# Patient Record
Sex: Female | Born: 1985 | Race: Black or African American | Hispanic: No | Marital: Single | Smoking: Former smoker
Health system: Southern US, Community
[De-identification: ages and names within clinical notes are randomized; demographics above are authoritative.]

## PROBLEM LIST (undated history)

## (undated) ENCOUNTER — Inpatient Hospital Stay (HOSPITAL_COMMUNITY): Payer: Self-pay

## (undated) DIAGNOSIS — N390 Urinary tract infection, site not specified: Secondary | ICD-10-CM

## (undated) DIAGNOSIS — Z789 Other specified health status: Secondary | ICD-10-CM

## (undated) DIAGNOSIS — Z8619 Personal history of other infectious and parasitic diseases: Secondary | ICD-10-CM

## (undated) DIAGNOSIS — Z87440 Personal history of urinary (tract) infections: Secondary | ICD-10-CM

## (undated) DIAGNOSIS — D696 Thrombocytopenia, unspecified: Secondary | ICD-10-CM

## (undated) DIAGNOSIS — O26859 Spotting complicating pregnancy, unspecified trimester: Secondary | ICD-10-CM

## (undated) DIAGNOSIS — Z22330 Carrier of Group B streptococcus: Secondary | ICD-10-CM

## (undated) DIAGNOSIS — Z8759 Personal history of other complications of pregnancy, childbirth and the puerperium: Secondary | ICD-10-CM

## (undated) DIAGNOSIS — Z331 Pregnant state, incidental: Secondary | ICD-10-CM

## (undated) HISTORY — DX: Spotting complicating pregnancy, unspecified trimester: O26.859

## (undated) HISTORY — DX: Personal history of urinary (tract) infections: Z87.440

## (undated) HISTORY — DX: Thrombocytopenia, unspecified: D69.6

## (undated) HISTORY — DX: Carrier of group B Streptococcus: Z22.330

## (undated) HISTORY — DX: Personal history of other infectious and parasitic diseases: Z86.19

## (undated) HISTORY — DX: Personal history of other complications of pregnancy, childbirth and the puerperium: Z87.59

## (undated) HISTORY — DX: Pregnant state, incidental: Z33.1

## (undated) HISTORY — PX: NO PAST SURGERIES: SHX2092

---

## 2009-08-13 ENCOUNTER — Inpatient Hospital Stay (HOSPITAL_COMMUNITY): Admission: AD | Admit: 2009-08-13 | Discharge: 2009-08-14 | Payer: Self-pay | Admitting: Obstetrics & Gynecology

## 2009-08-14 ENCOUNTER — Inpatient Hospital Stay (HOSPITAL_COMMUNITY): Admission: AD | Admit: 2009-08-14 | Discharge: 2009-08-16 | Payer: Self-pay | Admitting: Obstetrics and Gynecology

## 2009-08-18 ENCOUNTER — Inpatient Hospital Stay (HOSPITAL_COMMUNITY): Admission: AD | Admit: 2009-08-18 | Discharge: 2009-08-20 | Payer: Self-pay | Admitting: Obstetrics and Gynecology

## 2009-09-13 ENCOUNTER — Inpatient Hospital Stay (HOSPITAL_COMMUNITY): Admission: AD | Admit: 2009-09-13 | Discharge: 2009-09-13 | Payer: Self-pay | Admitting: Obstetrics and Gynecology

## 2010-05-20 LAB — COMPREHENSIVE METABOLIC PANEL
ALT: 12 U/L (ref 0–35)
AST: 15 U/L (ref 0–37)
AST: 16 U/L (ref 0–37)
AST: 16 U/L (ref 0–37)
Albumin: 2.4 g/dL — ABNORMAL LOW (ref 3.5–5.2)
Albumin: 2.6 g/dL — ABNORMAL LOW (ref 3.5–5.2)
Alkaline Phosphatase: 100 U/L (ref 39–117)
Alkaline Phosphatase: 81 U/L (ref 39–117)
Alkaline Phosphatase: 83 U/L (ref 39–117)
Alkaline Phosphatase: 91 U/L (ref 39–117)
BUN: 7 mg/dL (ref 6–23)
CO2: 22 mEq/L (ref 19–32)
CO2: 26 mEq/L (ref 19–32)
Calcium: 8.4 mg/dL (ref 8.4–10.5)
Calcium: 8.6 mg/dL (ref 8.4–10.5)
Chloride: 105 mEq/L (ref 96–112)
Chloride: 106 mEq/L (ref 96–112)
Chloride: 109 mEq/L (ref 96–112)
Creatinine, Ser: 0.85 mg/dL (ref 0.4–1.2)
GFR calc Af Amer: >60 mL/min (ref >60)
GFR calc non Af Amer: >60 mL/min (ref >60)
GFR calc non Af Amer: >60 mL/min (ref >60)
Glucose, Bld: 92 mg/dL (ref 70–99)
Potassium: 3.4 mEq/L — ABNORMAL LOW (ref 3.5–5.1)
Potassium: 3.9 mEq/L (ref 3.5–5.1)
Sodium: 136 mEq/L (ref 135–145)
Sodium: 136 mEq/L (ref 135–145)
Total Bilirubin: 0.4 mg/dL (ref 0.3–1.2)
Total Bilirubin: 0.5 mg/dL (ref 0.3–1.2)
Total Protein: 4.6 g/dL — ABNORMAL LOW (ref 6.0–8.3)
Total Protein: 6.9 g/dL (ref 6.0–8.3)

## 2010-05-20 LAB — CBC
HCT: 38.9 % (ref 36.0–46.0)
Hemoglobin: 10.4 g/dL — ABNORMAL LOW (ref 12.0–15.0)
Hemoglobin: 11.2 g/dL — ABNORMAL LOW (ref 12.0–15.0)
Hemoglobin: 13.2 g/dL (ref 12.0–15.0)
MCH: 32.3 pg (ref 26.0–34.0)
MCHC: 34.6 g/dL (ref 30.0–36.0)
MCHC: 35 g/dL (ref 30.0–36.0)
MCV: 95.6 fL (ref 78.0–100.0)
MCV: 97.3 fL (ref 78.0–100.0)
MCV: 97.4 fL (ref 78.0–100.0)
Platelets: 158 10*3/uL (ref 150–400)
Platelets: 166 10*3/uL (ref 150–400)
Platelets: 224 10*3/uL (ref 150–400)
RBC: 3.08 MIL/uL — ABNORMAL LOW (ref 3.87–5.11)
RBC: 3.28 MIL/uL — ABNORMAL LOW (ref 3.87–5.11)
RBC: 4.07 MIL/uL (ref 3.87–5.11)
RDW: 13.4 % (ref 11.5–15.5)
WBC: 7.6 10*3/uL (ref 4.0–10.5)
WBC: 9.6 10*3/uL (ref 4.0–10.5)

## 2010-05-20 LAB — DIFFERENTIAL
Basophils Absolute: 0 10*3/uL (ref 0.0–0.1)
Basophils Absolute: 0 10*3/uL (ref 0.0–0.1)
Basophils Relative: 0 % (ref 0–1)
Eosinophils Absolute: 0.1 10*3/uL (ref 0.0–0.7)
Eosinophils Absolute: 0.5 10*3/uL (ref 0.0–0.7)
Eosinophils Relative: 1 % (ref 0–5)
Lymphocytes Relative: 32 % (ref 12–46)
Lymphs Abs: 1.6 10*3/uL (ref 0.7–4.0)
Monocytes Absolute: 1.4 10*3/uL — ABNORMAL HIGH (ref 0.1–1.0)
Monocytes Relative: 7 % (ref 3–12)
Neutro Abs: 4.2 10*3/uL (ref 1.7–7.7)
Neutro Abs: 7.6 10*3/uL (ref 1.7–7.7)
Neutrophils Relative %: 55 % (ref 43–77)
Neutrophils Relative %: 70 % (ref 43–77)

## 2010-05-20 LAB — LACTATE DEHYDROGENASE: LDH: 109 U/L (ref 94–250)

## 2010-05-20 LAB — URIC ACID
Uric Acid, Serum: 5.6 mg/dL (ref 2.4–7.0)
Uric Acid, Serum: 5.9 mg/dL (ref 2.4–7.0)

## 2010-05-20 LAB — URINALYSIS, ROUTINE W REFLEX MICROSCOPIC
Bilirubin Urine: NEGATIVE
Ketones, ur: NEGATIVE mg/dL
Specific Gravity, Urine: 1.025 (ref 1.005–1.030)
Urobilinogen, UA: 1 mg/dL (ref 0.0–1.0)
pH: 6.5 (ref 5.0–8.0)

## 2010-05-20 LAB — URINE CULTURE

## 2010-05-20 LAB — URINE MICROSCOPIC-ADD ON

## 2010-05-20 LAB — CCBB MATERNAL DONOR DRAW

## 2010-05-20 LAB — RPR: RPR Ser Ql: NONREACTIVE

## 2010-05-21 LAB — URINALYSIS, ROUTINE W REFLEX MICROSCOPIC
Bilirubin Urine: NEGATIVE
Hgb urine dipstick: NEGATIVE
Ketones, ur: 15 mg/dL — AB
Ketones, ur: NEGATIVE mg/dL
Nitrite: NEGATIVE
Protein, ur: NEGATIVE mg/dL
Specific Gravity, Urine: 1.015 (ref 1.005–1.030)
Urobilinogen, UA: 1 mg/dL (ref 0.0–1.0)
pH: 7.5 (ref 5.0–8.0)

## 2010-05-21 LAB — URINE CULTURE

## 2010-05-21 LAB — COMPREHENSIVE METABOLIC PANEL
ALT: 14 U/L (ref 0–35)
Alkaline Phosphatase: 113 U/L (ref 39–117)
BUN: 3 mg/dL — ABNORMAL LOW (ref 6–23)
Chloride: 105 mEq/L (ref 96–112)
Glucose, Bld: 91 mg/dL (ref 70–99)
Potassium: 3.7 mEq/L (ref 3.5–5.1)
Sodium: 135 mEq/L (ref 135–145)
Total Bilirubin: 0.6 mg/dL (ref 0.3–1.2)
Total Protein: 6.5 g/dL (ref 6.0–8.3)

## 2010-05-21 LAB — DIFFERENTIAL
Basophils Absolute: 0 10*3/uL (ref 0.0–0.1)
Basophils Absolute: 0 10*3/uL (ref 0.0–0.1)
Lymphocytes Relative: 6 % — ABNORMAL LOW (ref 12–46)
Lymphocytes Relative: 8 % — ABNORMAL LOW (ref 12–46)
Lymphs Abs: 1.1 10*3/uL (ref 0.7–4.0)
Monocytes Absolute: 1.3 10*3/uL — ABNORMAL HIGH (ref 0.1–1.0)
Neutro Abs: 11.5 10*3/uL — ABNORMAL HIGH (ref 1.7–7.7)
Neutro Abs: 13.3 10*3/uL — ABNORMAL HIGH (ref 1.7–7.7)
Neutrophils Relative %: 89 % — ABNORMAL HIGH (ref 43–77)

## 2010-05-21 LAB — CBC
HCT: 36.7 % (ref 36.0–46.0)
Hemoglobin: 11.3 g/dL — ABNORMAL LOW (ref 12.0–15.0)
Hemoglobin: 12.5 g/dL (ref 12.0–15.0)
Platelets: 143 10*3/uL — ABNORMAL LOW (ref 150–400)
RBC: 3.35 MIL/uL — ABNORMAL LOW (ref 3.87–5.11)
RBC: 3.76 MIL/uL — ABNORMAL LOW (ref 3.87–5.11)
RDW: 13.8 % (ref 11.5–15.5)
RDW: 13.8 % (ref 11.5–15.5)
RDW: 13.9 % (ref 11.5–15.5)
WBC: 12.9 10*3/uL — ABNORMAL HIGH (ref 4.0–10.5)
WBC: 14 10*3/uL — ABNORMAL HIGH (ref 4.0–10.5)
WBC: 15 10*3/uL — ABNORMAL HIGH (ref 4.0–10.5)

## 2010-05-21 LAB — CREATININE CLEARANCE, URINE, 24 HOUR
Creatinine, Urine: 26.2 mg/dL
Creatinine: 0.88 mg/dL (ref 0.4–1.2)

## 2010-05-21 LAB — LACTATE DEHYDROGENASE: LDH: 155 U/L (ref 94–250)

## 2010-05-21 LAB — WET PREP, GENITAL: Clue Cells Wet Prep HPF POC: NONE SEEN

## 2010-05-21 LAB — RPR: RPR Ser Ql: NONREACTIVE

## 2010-05-21 LAB — PROTEIN, URINE, 24 HOUR

## 2011-01-10 ENCOUNTER — Emergency Department (HOSPITAL_COMMUNITY)
Admission: EM | Admit: 2011-01-10 | Discharge: 2011-01-10 | Disposition: A | Discharge status: Home / Self Care | Payer: Medicaid Other | Attending: Emergency Medicine | Admitting: Emergency Medicine

## 2011-01-10 ENCOUNTER — Encounter (HOSPITAL_COMMUNITY): Payer: Self-pay | Admitting: *Deleted

## 2011-01-10 DIAGNOSIS — Z331 Pregnant state, incidental: Secondary | ICD-10-CM

## 2011-01-10 DIAGNOSIS — Z3201 Encounter for pregnancy test, result positive: Secondary | ICD-10-CM | POA: Insufficient documentation

## 2011-01-10 LAB — URINALYSIS, ROUTINE W REFLEX MICROSCOPIC
Bilirubin Urine: NEGATIVE
Glucose, UA: NEGATIVE mg/dL
Hgb urine dipstick: NEGATIVE
Ketones, ur: NEGATIVE mg/dL
Nitrite: POSITIVE — AB
Protein, ur: NEGATIVE mg/dL
Specific Gravity, Urine: 1.024 (ref 1.005–1.030)
Urobilinogen, UA: 1 mg/dL (ref 0.0–1.0)
pH: 7 (ref 5.0–8.0)

## 2011-01-10 LAB — URINE MICROSCOPIC-ADD ON

## 2011-01-10 LAB — POCT PREGNANCY, URINE: Preg Test, Ur: POSITIVE

## 2011-01-10 NOTE — ED Provider Notes (Signed)
History   This is G1P1 patient presents to the ED with a request for a pregnancy test.  She stated her last menstrual period was September 22. She noticed that she was late and proceed to get a home pregnancy test, which is positive. She mentioned that she talks to the OB/GYN, Dr. Debria Garret office who recommends patient to come to the ED to have a documented positive pregnancy test in order to qualify for Medicaid. She denies abdominal pain, or vaginal bleeding. She has no other complaints.  CSN: [id number] Arrival date & time: 01/10/2011 11:02 AM   First MD Initiated Contact with Patient 01/10/11 1119      No chief complaint on file.   (Consider location/radiation/quality/duration/timing/severity/associated sxs/prior treatment) The history is provided by the patient. No language interpreter was used.    History reviewed. No pertinent past medical history.  History reviewed. No pertinent past surgical history.  No family history on file.  History  Substance Use Topics  . Smoking status: Never Smoker   . Smokeless tobacco: Not on file  . Alcohol Use: No    OB History    Grav Para Term Preterm Abortions TAB SAB Ect Mult Living                  Review of Systems  All other systems reviewed and are negative.    Allergies  Review of patient's allergies indicates no known allergies.  Home Medications  No current outpatient prescriptions on file.  BP 129/82  Pulse 87  Temp(Src) 98.2 F (36.8 C) (Oral)  Resp 20  SpO2 100%  Physical Exam  Nursing note and vitals reviewed. Constitutional:       Awake, alert, nontoxic appearance  HENT:  Head: Atraumatic.  Eyes: Right eye exhibits no discharge. Left eye exhibits no discharge.  Neck: Neck supple.  Pulmonary/Chest: Effort normal. She exhibits no tenderness.  Abdominal: There is no tenderness. There is no rebound.  Musculoskeletal: She exhibits no tenderness.       Baseline ROM, no obvious new focal weakness    Neurological:       Mental status and motor strength appears baseline for patient and situation  Skin: No rash noted.  Psychiatric: She has a normal mood and affect.    ED Course  Procedures (including critical care time)   Labs Reviewed  POCT PREGNANCY, URINE  URINALYSIS, ROUTINE W REFLEX MICROSCOPIC   No results found.   No diagnosis found.      MDM  Patient has a positive urine pregnancy test. She has no other complaint. HCG and Quant would not be checked at this time. Patient agrees to follow the OB/GYN.        Fayrene Helper, PA Resident 01/10/11 1146

## 2011-01-10 NOTE — ED Notes (Signed)
Pt states took home preg test and was positive....states called dr stringer office to request care and they advised her to come to the ed and get documentation of pregnancy so she could get medicaid to cover the pregnancy...  Pt denies bleeding or discharge.

## 2011-01-10 NOTE — ED Notes (Signed)
Just needs preg test  G 2 P1 A0 L1

## 2011-01-10 NOTE — ED Provider Notes (Signed)
Medical screening examination/treatment/procedure(s) were performed by non-physician practitioner and as supervising physician I was immediately available for consultation/collaboration.  Raeford Razor, MD 01/10/11 1754

## 2011-01-14 ENCOUNTER — Encounter (HOSPITAL_COMMUNITY): Payer: Self-pay | Admitting: *Deleted

## 2011-01-14 NOTE — ED Notes (Signed)
Pt seen at [facility], sent to [facility] for proof of preg letter.

## 2011-01-22 LAB — OB RESULTS CONSOLE HIV ANTIBODY (ROUTINE TESTING): HIV: NONREACTIVE

## 2011-01-22 LAB — OB RESULTS CONSOLE RUBELLA ANTIBODY, IGM: Rubella: IMMUNE

## 2011-02-27 ENCOUNTER — Encounter (HOSPITAL_COMMUNITY): Payer: Self-pay | Admitting: *Deleted

## 2011-02-27 ENCOUNTER — Inpatient Hospital Stay (HOSPITAL_COMMUNITY)
Admission: AD | Admit: 2011-02-27 | Discharge: 2011-02-27 | Disposition: A | Discharge status: Home / Self Care | Payer: Medicaid Other | Source: Ambulatory Visit | Attending: Obstetrics and Gynecology | Admitting: Obstetrics and Gynecology

## 2011-02-27 DIAGNOSIS — O99891 Other specified diseases and conditions complicating pregnancy: Secondary | ICD-10-CM | POA: Insufficient documentation

## 2011-02-27 DIAGNOSIS — R109 Unspecified abdominal pain: Secondary | ICD-10-CM | POA: Insufficient documentation

## 2011-02-27 DIAGNOSIS — O98919 Unspecified maternal infectious and parasitic disease complicating pregnancy, unspecified trimester: Secondary | ICD-10-CM

## 2011-02-27 HISTORY — DX: Urinary tract infection, site not specified: N39.0

## 2011-02-27 LAB — URINALYSIS, ROUTINE W REFLEX MICROSCOPIC
Bilirubin Urine: NEGATIVE
Hgb urine dipstick: NEGATIVE
Ketones, ur: NEGATIVE mg/dL
Protein, ur: NEGATIVE mg/dL
Urobilinogen, UA: 0.2 mg/dL (ref 0.0–1.0)

## 2011-02-27 NOTE — Progress Notes (Signed)
Pt states lower abd shooting pain into her vagina that began last pm, no intercourse since October. Denies bleeding or vaginal d/c changes. +FHT's in triage.

## 2011-02-27 NOTE — Progress Notes (Signed)
History    25 yo g2p1 LMP certain 11/24/2010  With [facility] 08/31/11 presents with c/o of sporatic cramping all week, more crampy last night, shower makes me more comfortable, hx of frequent UTIs, Denies N,V,D, or UTI s/s, no vag bleeding or leaking fluid, no vag irritation. Chief Complaint  Patient presents with  . Abdominal Pain     OB History    Grav Para Term Preterm Abortions TAB SAB Ect Mult Living   2 1 1  0 0 0 0 0 0 1      Past Medical History  Diagnosis Date  . Urinary tract infection     Past Surgical History  Procedure Date  . No past surgeries     Family History  Problem Relation Age of Onset  . Anesthesia problems Neg Hx     History  Substance Use Topics  . Smoking status: Former Games developer  . Smokeless tobacco: Never Used  . Alcohol Use: No    Allergies: No Known Allergies  Prescriptions prior to admission  Medication Sig Dispense Refill  . folic acid (FOLVITE) 1 MG tablet Take 1 mg by mouth daily.           Physical Exam  Abd soft nontender, EGBUS wnl, cervix LTC no CMT, 12 week size Blood pressure 124/84, pulse 76, temperature 98.8 F (37.1 C), temperature source Oral, resp. rate 16, height 5\' 7"  (1.702 m), last menstrual period 11/24/2010. UA neg, UA C&S pending  A probable 12 week IUP P routine follow up insurance pending, ua C&S pending, discussed increase po water, frequent voids, tylenol, motrin for discomfort. ABCs of pg given. Discussed PNV daily. Lavera Guise, CNM

## 2011-02-27 NOTE — Progress Notes (Signed)
Sharp pain, pulling sensation noted increase past wk, esp when up and walking.  Had cramping initially.

## 2011-02-28 LAB — URINE CULTURE: Culture: NO GROWTH

## 2011-03-05 NOTE — L&D Delivery Note (Signed)
Delivery Note At 5:09 PM a viable female was delivered via Vaginal, Spontaneous Delivery (Presentation: Left Occiput Anterior).     Positon, easy delivery of the head, easy delivery of the shoulders Baby placed on pt abd, cord doubly clamped and cut Placenta schultze 3 vessel cord intact APGAR: 8, 8; weight .     Anesthesia: Epidural  Episiotomy: None Lacerations: 2 degree vag lac superficial Suture Repair: 4-0 monocryl Est. Blood Loss (mL): 200  Mom to postpartum.  Baby to rooming in.  Tiffany Tapia 08/25/2011, 5:41 PM

## 2011-05-27 ENCOUNTER — Encounter (INDEPENDENT_AMBULATORY_CARE_PROVIDER_SITE_OTHER): Payer: Medicaid Other | Admitting: Obstetrics and Gynecology

## 2011-05-27 ENCOUNTER — Other Ambulatory Visit: Payer: Medicaid Other

## 2011-05-27 DIAGNOSIS — Z3480 Encounter for supervision of other normal pregnancy, unspecified trimester: Secondary | ICD-10-CM

## 2011-06-19 ENCOUNTER — Ambulatory Visit (INDEPENDENT_AMBULATORY_CARE_PROVIDER_SITE_OTHER): Payer: Medicaid Other | Admitting: Obstetrics and Gynecology

## 2011-06-19 VITALS — BP 100/79 | Wt 163.0 lb

## 2011-06-19 DIAGNOSIS — Z331 Pregnant state, incidental: Secondary | ICD-10-CM

## 2011-06-19 NOTE — Progress Notes (Signed)
Addended by: Malissa Hippo. on: 06/19/2011 05:54 PM   Modules accepted: Orders

## 2011-06-19 NOTE — Progress Notes (Signed)
No c/o GFM rv'd PTL sx's, and FKC Will drawn abo to confirm O pos per consult with Dr. Normand Sloop

## 2011-06-20 ENCOUNTER — Telehealth: Payer: Self-pay | Admitting: Obstetrics and Gynecology

## 2011-06-20 LAB — ABO AND RH: Rh Type: POSITIVE

## 2011-06-20 NOTE — Telephone Encounter (Signed)
Routed to triage 

## 2011-06-24 ENCOUNTER — Telehealth: Payer: Self-pay | Admitting: Obstetrics and Gynecology

## 2011-06-24 NOTE — Telephone Encounter (Signed)
Cant leave msg on pt's voice mail. Phone not in service. bt cma

## 2011-06-25 MED ORDER — PRENATAL MULTIVITAMIN CH: 1.0000 | ORAL_TABLET | Freq: Every day | ORAL | Status: AC

## 2011-06-25 NOTE — Telephone Encounter (Signed)
Lm on vm rx sent to pharm call office if she has any additional questions or concerns

## 2011-07-04 ENCOUNTER — Ambulatory Visit (INDEPENDENT_AMBULATORY_CARE_PROVIDER_SITE_OTHER): Payer: Medicaid Other | Admitting: Obstetrics and Gynecology

## 2011-07-04 ENCOUNTER — Encounter: Payer: Self-pay | Admitting: Obstetrics and Gynecology

## 2011-07-04 VITALS — BP 98/58 | Wt 169.0 lb

## 2011-07-04 DIAGNOSIS — Z331 Pregnant state, incidental: Secondary | ICD-10-CM

## 2011-07-04 HISTORY — DX: Pregnant state, incidental: Z33.1

## 2011-07-04 LAB — GLUCOSE, POCT (MANUAL RESULT ENTRY): POC Glucose: 114

## 2011-07-04 NOTE — Progress Notes (Signed)
Blood Glucose- 114 Pt states she had a peanut butter jelly sandwich, cheetos, chocolate shake & coke @ 2:00pm

## 2011-07-04 NOTE — Progress Notes (Signed)
Patient ID: Tiffany Tapia, female   DOB: 21-Apr-1985, 26 y.o.   MRN: [id number] Reviewed s/s preterm labor, srom, vag bleeding, kick counts to report, enc 8 water daily and frequent voids

## 2011-07-04 NOTE — Patient Instructions (Signed)

## 2011-07-15 ENCOUNTER — Ambulatory Visit (INDEPENDENT_AMBULATORY_CARE_PROVIDER_SITE_OTHER): Payer: Medicaid Other | Admitting: Obstetrics and Gynecology

## 2011-07-15 ENCOUNTER — Encounter: Payer: Self-pay | Admitting: Obstetrics and Gynecology

## 2011-07-15 VITALS — BP 110/70 | Wt 173.0 lb

## 2011-07-15 DIAGNOSIS — Z331 Pregnant state, incidental: Secondary | ICD-10-CM

## 2011-07-15 NOTE — Progress Notes (Signed)
C/o left lower back pain, sneezing makes it worst, pt states had same back issues with 1st pregnancy

## 2011-07-15 NOTE — Progress Notes (Signed)
Patient ID: Tiffany Tapia, female   DOB: Apr 25, 1985, 26 y.o.   MRN: [id number] Reviewed s/s preterm labor, srom, vag bleeding, kick counts to report, enc 8 water daily and frequent voids, common discomforts of pg reviewed. Lavera Guise, CNM

## 2011-07-31 ENCOUNTER — Encounter: Payer: Self-pay | Admitting: Obstetrics and Gynecology

## 2011-07-31 ENCOUNTER — Ambulatory Visit (INDEPENDENT_AMBULATORY_CARE_PROVIDER_SITE_OTHER): Payer: Medicaid Other | Admitting: Obstetrics and Gynecology

## 2011-07-31 VITALS — BP 108/60 | Ht 67.0 in | Wt 177.0 lb

## 2011-07-31 DIAGNOSIS — Z331 Pregnant state, incidental: Secondary | ICD-10-CM

## 2011-07-31 NOTE — Progress Notes (Signed)
C/o lower left back pain. Also c/o vaginal pain. "Hurts to walk" GBS/GC/CHL DONE Checklist complete

## 2011-07-31 NOTE — Patient Instructions (Signed)
Contraception Choices Contraception (birth control) is the use of any methods or devices to prevent pregnancy. Below are some methods to help avoid pregnancy. HORMONAL METHODS   Contraceptive implant. This is a thin, plastic tube containing progesterone hormone. It does not contain estrogen hormone. Your caregiver inserts the tube in the inner part of the upper arm. The tube can remain in place for up to 3 years. After 3 years, the implant must be removed. The implant prevents the ovaries from releasing an egg (ovulation), thickens the cervical mucus which prevents sperm from entering the uterus, and thins the lining of the inside of the uterus.   Progesterone-only injections. These injections are given every 3 months by your caregiver to prevent pregnancy. This synthetic progesterone hormone stops the ovaries from releasing eggs. It also thickens cervical mucus and changes the uterine lining. This makes it harder for sperm to survive in the uterus.   Birth control pills. These pills contain estrogen and progesterone hormone. They work by stopping the egg from forming in the ovary (ovulation). Birth control pills are prescribed by a caregiver.Birth control pills can also be used to treat heavy periods.   Minipill. This type of birth control pill contains only the progesterone hormone. They are taken every day of each month and must be prescribed by your caregiver.   Birth control patch. The patch contains hormones similar to those in birth control pills. It must be changed once a week and is prescribed by a caregiver.   Vaginal ring. The ring contains hormones similar to those in birth control pills. It is left in the vagina for 3 weeks, removed for 1 week, and then a new one is put back in place. The patient must be comfortable inserting and removing the ring from the vagina.A caregiver's prescription is necessary.   Emergency contraception. Emergency contraceptives prevent pregnancy after  unprotected sexual intercourse. This pill can be taken right after sex or up to 5 days after unprotected sex. It is most effective the sooner you take the pills after having sexual intercourse. Emergency contraceptive pills are available without a prescription. Check with your pharmacist. Do not use emergency contraception as your only form of birth control.  BARRIER METHODS   Female condom. This is a thin sheath (latex or rubber) that is worn over the penis during sexual intercourse. It can be used with spermicide to increase effectiveness.   Female condom. This is a soft, loose-fitting sheath that is put into the vagina before sexual intercourse.   Diaphragm. This is a soft, latex, dome-shaped barrier that must be fitted by a caregiver. It is inserted into the vagina, along with a spermicidal jelly. It is inserted before intercourse. The diaphragm should be left in the vagina for 6 to 8 hours after intercourse.   Cervical cap. This is a round, soft, latex or plastic cup that fits over the cervix and must be fitted by a caregiver. The cap can be left in place for up to 48 hours after intercourse.   Sponge. This is a soft, circular piece of polyurethane foam. The sponge has spermicide in it. It is inserted into the vagina after wetting it and before sexual intercourse.   Spermicides. These are chemicals that kill or block sperm from entering the cervix and uterus. They come in the form of creams, jellies, suppositories, foam, or tablets. They do not require a prescription. They are inserted into the vagina with an applicator before having sexual intercourse. The process must be   repeated every time you have sexual intercourse.  INTRAUTERINE CONTRACEPTION  Intrauterine device (IUD). This is a T-shaped device that is put in a woman's uterus during a menstrual period to prevent pregnancy. There are 2 types:   Copper IUD. This type of IUD is wrapped in copper wire and is placed inside the uterus. Copper  makes the uterus and fallopian tubes produce a fluid that kills sperm. It can stay in place for 10 years.   Hormone IUD. This type of IUD contains the hormone progestin (synthetic progesterone). The hormone thickens the cervical mucus and prevents sperm from entering the uterus, and it also thins the uterine lining to prevent implantation of a fertilized egg. The hormone can weaken or kill the sperm that get into the uterus. It can stay in place for 5 years.  PERMANENT METHODS OF CONTRACEPTION  Female tubal ligation. This is when the woman's fallopian tubes are surgically sealed, tied, or blocked to prevent the egg from traveling to the uterus.   Female sterilization. This is when the female has the tubes that carry sperm tied off (vasectomy).This blocks sperm from entering the vagina during sexual intercourse. After the procedure, the man can still ejaculate fluid (semen).  NATURAL PLANNING METHODS  Natural family planning. This is not having sexual intercourse or using a barrier method (condom, diaphragm, cervical cap) on days the woman could become pregnant.   Calendar method. This is keeping track of the length of each menstrual cycle and identifying when you are fertile.   Ovulation method. This is avoiding sexual intercourse during ovulation.   Symptothermal method. This is avoiding sexual intercourse during ovulation, using a thermometer and ovulation symptoms.   Post-ovulation method. This is timing sexual intercourse after you have ovulated.  Regardless of which type or method of contraception you choose, it is important that you use condoms to protect against the transmission of sexually transmitted diseases (STDs). Talk with your caregiver about which form of contraception is most appropriate for you. Document Released: 02/18/2005 Document Revised: 02/07/2011 Document Reviewed: 06/27/2010 [employer] Patient Information 2012 [employer]. 

## 2011-08-05 ENCOUNTER — Encounter: Payer: Self-pay | Admitting: Obstetrics and Gynecology

## 2011-08-05 ENCOUNTER — Ambulatory Visit (INDEPENDENT_AMBULATORY_CARE_PROVIDER_SITE_OTHER): Payer: Medicaid Other | Admitting: Obstetrics and Gynecology

## 2011-08-05 VITALS — BP 110/70 | Wt 179.0 lb

## 2011-08-05 DIAGNOSIS — Z331 Pregnant state, incidental: Secondary | ICD-10-CM

## 2011-08-05 NOTE — Patient Instructions (Signed)
Fetal Movement Counts Patient Name: __________________________________________________ Patient Due Date: ____________________ Kick counts is highly recommended in high risk pregnancies, but it is a good idea for every pregnant woman to do. Start counting fetal movements at 28 weeks of the pregnancy. Fetal movements increase after eating a full meal or eating or drinking something sweet (the blood sugar is higher). It is also important to drink plenty of fluids (well hydrated) before doing the count. Lie on your left side because it helps with the circulation or you can sit in a comfortable chair with your arms over your belly (abdomen) with no distractions around you. DOING THE COUNT  Try to do the count the same time of day each time you do it.   Mark the day and time, then see how long it takes for you to feel 10 movements (kicks, flutters, swishes, rolls). You should have at least 10 movements within 2 hours. You will most likely feel 10 movements in much less than 2 hours. If you do not, wait an hour and count again. After a couple of days you will see a pattern.   What you are looking for is a change in the pattern or not enough counts in 2 hours. Is it taking longer in time to reach 10 movements?  SEEK MEDICAL CARE IF:  You feel less than 10 counts in 2 hours. Tried twice.   No movement in one hour.   The pattern is changing or taking longer each day to reach 10 counts in 2 hours.   You feel the baby is not moving as it usually does.  Date: ____________ Movements: ____________ Start time: ____________ Finish time: ____________  Date: ____________ Movements: ____________ Start time: ____________ Finish time: ____________ Date: ____________ Movements: ____________ Start time: ____________ Finish time: ____________ Date: ____________ Movements: ____________ Start time: ____________ Finish time: ____________ Date: ____________ Movements: ____________ Start time: ____________ Finish time:  ____________ Date: ____________ Movements: ____________ Start time: ____________ Finish time: ____________ Date: ____________ Movements: ____________ Start time: ____________ Finish time: ____________ Date: ____________ Movements: ____________ Start time: ____________ Finish time: ____________  Date: ____________ Movements: ____________ Start time: ____________ Finish time: ____________ Date: ____________ Movements: ____________ Start time: ____________ Finish time: ____________ Date: ____________ Movements: ____________ Start time: ____________ Finish time: ____________ Date: ____________ Movements: ____________ Start time: ____________ Finish time: ____________ Date: ____________ Movements: ____________ Start time: ____________ Finish time: ____________ Date: ____________ Movements: ____________ Start time: ____________ Finish time: ____________ Date: ____________ Movements: ____________ Start time: ____________ Finish time: ____________  Date: ____________ Movements: ____________ Start time: ____________ Finish time: ____________ Date: ____________ Movements: ____________ Start time: ____________ Finish time: ____________ Date: ____________ Movements: ____________ Start time: ____________ Finish time: ____________ Date: ____________ Movements: ____________ Start time: ____________ Finish time: ____________ Date: ____________ Movements: ____________ Start time: ____________ Finish time: ____________ Date: ____________ Movements: ____________ Start time: ____________ Finish time: ____________ Date: ____________ Movements: ____________ Start time: ____________ Finish time: ____________  Date: ____________ Movements: ____________ Start time: ____________ Finish time: ____________ Date: ____________ Movements: ____________ Start time: ____________ Finish time: ____________ Date: ____________ Movements: ____________ Start time: ____________ Finish time: ____________ Date: ____________ Movements:  ____________ Start time: ____________ Finish time: ____________ Date: ____________ Movements: ____________ Start time: ____________ Finish time: ____________ Date: ____________ Movements: ____________ Start time: ____________ Finish time: ____________ Date: ____________ Movements: ____________ Start time: ____________ Finish time: ____________  Date: ____________ Movements: ____________ Start time: ____________ Finish time: ____________ Date: ____________ Movements: ____________ Start time: ____________ Finish time: ____________ Date: ____________ Movements: ____________ Start time:   ____________ Finish time: ____________ Date: ____________ Movements: ____________ Start time: ____________ Finish time: ____________ Date: ____________ Movements: ____________ Start time: ____________ Finish time: ____________ Date: ____________ Movements: ____________ Start time: ____________ Finish time: ____________ Date: ____________ Movements: ____________ Start time: ____________ Finish time: ____________  Date: ____________ Movements: ____________ Start time: ____________ Finish time: ____________ Date: ____________ Movements: ____________ Start time: ____________ Finish time: ____________ Date: ____________ Movements: ____________ Start time: ____________ Finish time: ____________ Date: ____________ Movements: ____________ Start time: ____________ Finish time: ____________ Date: ____________ Movements: ____________ Start time: ____________ Finish time: ____________ Date: ____________ Movements: ____________ Start time: ____________ Finish time: ____________ Date: ____________ Movements: ____________ Start time: ____________ Finish time: ____________  Date: ____________ Movements: ____________ Start time: ____________ Finish time: ____________ Date: ____________ Movements: ____________ Start time: ____________ Finish time: ____________ Date: ____________ Movements: ____________ Start time: ____________ Finish  time: ____________ Date: ____________ Movements: ____________ Start time: ____________ Finish time: ____________ Date: ____________ Movements: ____________ Start time: ____________ Finish time: ____________ Date: ____________ Movements: ____________ Start time: ____________ Finish time: ____________ Date: ____________ Movements: ____________ Start time: ____________ Finish time: ____________  Date: ____________ Movements: ____________ Start time: ____________ Finish time: ____________ Date: ____________ Movements: ____________ Start time: ____________ Finish time: ____________ Date: ____________ Movements: ____________ Start time: ____________ Finish time: ____________ Date: ____________ Movements: ____________ Start time: ____________ Finish time: ____________ Date: ____________ Movements: ____________ Start time: ____________ Finish time: ____________ Date: ____________ Movements: ____________ Start time: ____________ Finish time: ____________ Document Released: 03/20/2006 Document Revised: 02/07/2011 Document Reviewed: 09/20/2008 [employer] Patient Information 2012 [employer]. 

## 2011-08-05 NOTE — Progress Notes (Signed)
A/P GBS negative Fetal kick counts reviewed Labor reviewed with pt All patients  questions answered 

## 2011-08-05 NOTE — Progress Notes (Signed)
Pt states no concerns today.   

## 2011-08-13 ENCOUNTER — Encounter: Payer: Medicaid Other | Admitting: Obstetrics and Gynecology

## 2011-08-20 ENCOUNTER — Encounter: Payer: Self-pay | Admitting: Obstetrics and Gynecology

## 2011-08-20 ENCOUNTER — Ambulatory Visit (INDEPENDENT_AMBULATORY_CARE_PROVIDER_SITE_OTHER): Payer: Medicaid Other | Admitting: Obstetrics and Gynecology

## 2011-08-20 VITALS — BP 110/64 | Wt 178.0 lb

## 2011-08-20 DIAGNOSIS — Z3490 Encounter for supervision of normal pregnancy, unspecified, unspecified trimester: Secondary | ICD-10-CM

## 2011-08-20 NOTE — Progress Notes (Signed)
Pt wants cervix checked today 

## 2011-08-20 NOTE — Progress Notes (Signed)
C/o ctxs FKCs and Labor Precautions Pt appears a little uncomfortable with ctxs.  Will place on monitor. NST reactive.  No obvious ctxs. RTO 1wk

## 2011-08-25 ENCOUNTER — Encounter (HOSPITAL_COMMUNITY): Payer: Self-pay | Admitting: *Deleted

## 2011-08-25 ENCOUNTER — Inpatient Hospital Stay (HOSPITAL_COMMUNITY)
Admission: AD | Admit: 2011-08-25 | Discharge: 2011-08-26 | DRG: 775 | Disposition: A | Discharge status: Home / Self Care | Payer: Medicaid Other | Source: Ambulatory Visit | Attending: Obstetrics and Gynecology | Admitting: Obstetrics and Gynecology

## 2011-08-25 ENCOUNTER — Inpatient Hospital Stay (HOSPITAL_COMMUNITY): Payer: Medicaid Other | Admitting: Anesthesiology

## 2011-08-25 ENCOUNTER — Encounter (HOSPITAL_COMMUNITY): Payer: Self-pay | Admitting: Anesthesiology

## 2011-08-25 DIAGNOSIS — O9912 Other diseases of the blood and blood-forming organs and certain disorders involving the immune mechanism complicating childbirth: Secondary | ICD-10-CM | POA: Diagnosis present

## 2011-08-25 DIAGNOSIS — IMO0001 Reserved for inherently not codable concepts without codable children: Secondary | ICD-10-CM

## 2011-08-25 DIAGNOSIS — D696 Thrombocytopenia, unspecified: Secondary | ICD-10-CM | POA: Diagnosis present

## 2011-08-25 DIAGNOSIS — D689 Coagulation defect, unspecified: Secondary | ICD-10-CM | POA: Diagnosis present

## 2011-08-25 LAB — CBC
HCT: 37.6 % (ref 36.0–46.0)
Hemoglobin: 13.2 g/dL (ref 12.0–15.0)
MCV: 93.2 fL (ref 78.0–100.0)
MCV: 93.8 fL (ref 78.0–100.0)
Platelets: 139 10*3/uL — ABNORMAL LOW (ref 150–400)
RBC: 3.99 MIL/uL (ref 3.87–5.11)
RBC: 4.01 MIL/uL (ref 3.87–5.11)
RDW: 13 % (ref 11.5–15.5)
RDW: 13 % (ref 11.5–15.5)
WBC: 8.4 10*3/uL (ref 4.0–10.5)
WBC: 10.5 10*3/uL (ref 4.0–10.5)

## 2011-08-25 LAB — TYPE AND SCREEN: ABO/RH(D): O POS

## 2011-08-25 LAB — RPR: RPR Ser Ql: NONREACTIVE

## 2011-08-25 MED ORDER — FLEET ENEMA 7-19 GM/118ML RE ENEM: 1.0000 | ENEMA | RECTAL | Status: DC | PRN

## 2011-08-25 MED ORDER — TETANUS-DIPHTH-ACELL PERTUSSIS 5-2.5-18.5 LF-MCG/0.5 IM SUSP: 0.5000 mL | Freq: Once | INTRAMUSCULAR | Status: DC

## 2011-08-25 MED ORDER — PHENYLEPHRINE 40 MCG/ML (10ML) SYRINGE FOR IV PUSH (FOR BLOOD PRESSURE SUPPORT): 80.0000 ug | PREFILLED_SYRINGE | INTRAVENOUS | Status: DC | PRN

## 2011-08-25 MED ORDER — ZOLPIDEM TARTRATE 5 MG PO TABS: 5.0000 mg | ORAL_TABLET | Freq: Every evening | ORAL | Status: DC | PRN

## 2011-08-25 MED ORDER — BUTORPHANOL TARTRATE 2 MG/ML IJ SOLN: 2.0000 mg | INTRAMUSCULAR | Status: DC | PRN

## 2011-08-25 MED ORDER — DIPHENHYDRAMINE HCL 50 MG/ML IJ SOLN: 12.5000 mg | INTRAMUSCULAR | Status: DC | PRN

## 2011-08-25 MED ORDER — LACTATED RINGERS IV SOLN
INTRAVENOUS | Status: DC
  Administered 2011-08-25: 300 mL via INTRAVENOUS
  Administered 2011-08-25 (×2): via INTRAVENOUS

## 2011-08-25 MED ORDER — WITCH HAZEL-GLYCERIN EX PADS: 1.0000 "application " | MEDICATED_PAD | CUTANEOUS | Status: DC | PRN

## 2011-08-25 MED ORDER — SIMETHICONE 80 MG PO CHEW: 80.0000 mg | CHEWABLE_TABLET | ORAL | Status: DC | PRN

## 2011-08-25 MED ORDER — EPHEDRINE 5 MG/ML INJ: 10.0000 mg | INTRAVENOUS | Status: DC | PRN

## 2011-08-25 MED ORDER — LACTATED RINGERS IV SOLN: 500.0000 mL | INTRAVENOUS | Status: DC | PRN

## 2011-08-25 MED ORDER — IBUPROFEN 600 MG PO TABS
600.0000 mg | ORAL_TABLET | Freq: Four times a day (QID) | ORAL | Status: DC
  Administered 2011-08-26 (×3): 600 mg via ORAL
  Filled 2011-08-25 (×3): qty 1

## 2011-08-25 MED ORDER — ONDANSETRON HCL 4 MG PO TABS: 4.0000 mg | ORAL_TABLET | ORAL | Status: DC | PRN

## 2011-08-25 MED ORDER — BENZOCAINE-MENTHOL 20-0.5 % EX AERO
1.0000 "application " | INHALATION_SPRAY | CUTANEOUS | Status: DC | PRN
  Administered 2011-08-25: 1 via TOPICAL
  Filled 2011-08-25: qty 56

## 2011-08-25 MED ORDER — FENTANYL 2.5 MCG/ML BUPIVACAINE 1/10 % EPIDURAL INFUSION (WH - ANES)
INTRAMUSCULAR | Status: DC | PRN
  Administered 2011-08-25: 14 mL/h via EPIDURAL

## 2011-08-25 MED ORDER — EPHEDRINE 5 MG/ML INJ
10.0000 mg | INTRAVENOUS | Status: DC | PRN
  Filled 2011-08-25: qty 4

## 2011-08-25 MED ORDER — SENNOSIDES-DOCUSATE SODIUM 8.6-50 MG PO TABS
2.0000 | ORAL_TABLET | Freq: Every day | ORAL | Status: DC
  Administered 2011-08-25: 2 via ORAL

## 2011-08-25 MED ORDER — OXYTOCIN 40 UNITS IN LACTATED RINGERS INFUSION - SIMPLE MED
62.5000 mL/h | Freq: Once | INTRAVENOUS | Status: DC
  Filled 2011-08-25: qty 1000

## 2011-08-25 MED ORDER — CITRIC ACID-SODIUM CITRATE 334-500 MG/5ML PO SOLN: 30.0000 mL | ORAL | Status: DC | PRN

## 2011-08-25 MED ORDER — DIBUCAINE 1 % RE OINT: 1.0000 "application " | TOPICAL_OINTMENT | RECTAL | Status: DC | PRN

## 2011-08-25 MED ORDER — PRENATAL MULTIVITAMIN CH
1.0000 | ORAL_TABLET | Freq: Every day | ORAL | Status: DC
  Administered 2011-08-26: 1 via ORAL
  Filled 2011-08-25: qty 1

## 2011-08-25 MED ORDER — PHENYLEPHRINE 40 MCG/ML (10ML) SYRINGE FOR IV PUSH (FOR BLOOD PRESSURE SUPPORT)
80.0000 ug | PREFILLED_SYRINGE | INTRAVENOUS | Status: DC | PRN
  Filled 2011-08-25: qty 5

## 2011-08-25 MED ORDER — OXYCODONE-ACETAMINOPHEN 5-325 MG PO TABS
1.0000 | ORAL_TABLET | ORAL | Status: DC | PRN
  Administered 2011-08-26: 2 via ORAL
  Administered 2011-08-26: 1 via ORAL
  Filled 2011-08-25 (×3): qty 1

## 2011-08-25 MED ORDER — ACETAMINOPHEN 325 MG PO TABS: 650.0000 mg | ORAL_TABLET | ORAL | Status: DC | PRN

## 2011-08-25 MED ORDER — OXYCODONE-ACETAMINOPHEN 5-325 MG PO TABS: 1.0000 | ORAL_TABLET | ORAL | Status: DC | PRN

## 2011-08-25 MED ORDER — ONDANSETRON HCL 4 MG/2ML IJ SOLN: 4.0000 mg | INTRAMUSCULAR | Status: DC | PRN

## 2011-08-25 MED ORDER — LANOLIN HYDROUS EX OINT: TOPICAL_OINTMENT | CUTANEOUS | Status: DC | PRN

## 2011-08-25 MED ORDER — DIPHENHYDRAMINE HCL 25 MG PO CAPS: 25.0000 mg | ORAL_CAPSULE | Freq: Four times a day (QID) | ORAL | Status: DC | PRN

## 2011-08-25 MED ORDER — LACTATED RINGERS IV SOLN: 500.0000 mL | Freq: Once | INTRAVENOUS | Status: DC

## 2011-08-25 MED ORDER — IBUPROFEN 600 MG PO TABS: 600.0000 mg | ORAL_TABLET | Freq: Four times a day (QID) | ORAL | Status: DC | PRN

## 2011-08-25 MED ORDER — FENTANYL 2.5 MCG/ML BUPIVACAINE 1/10 % EPIDURAL INFUSION (WH - ANES)
14.0000 mL/h | INTRAMUSCULAR | Status: DC
  Filled 2011-08-25: qty 60

## 2011-08-25 MED ORDER — ONDANSETRON HCL 4 MG/2ML IJ SOLN: 4.0000 mg | Freq: Four times a day (QID) | INTRAMUSCULAR | Status: DC | PRN

## 2011-08-25 MED ORDER — OXYTOCIN BOLUS FROM INFUSION
250.0000 mL | Freq: Once | INTRAVENOUS | Status: DC
  Filled 2011-08-25: qty 500

## 2011-08-25 MED ORDER — LIDOCAINE HCL (PF) 1 % IJ SOLN
INTRAMUSCULAR | Status: DC | PRN
  Administered 2011-08-25: 8 mL
  Administered 2011-08-25: 9 mL

## 2011-08-25 MED ORDER — LIDOCAINE HCL (PF) 1 % IJ SOLN
30.0000 mL | INTRAMUSCULAR | Status: DC | PRN
  Filled 2011-08-25: qty 30

## 2011-08-25 NOTE — MAU Note (Signed)
C/o ucs since last night around midnight;

## 2011-08-25 NOTE — Progress Notes (Signed)
Contractions feel sharper now, ready for epidural O VSS      fhts category 1      abd soft between uc      Contractions q 2-4 mod      Vag 6 100 -1 VTX bulging membranes        Results for orders placed during the hospital encounter of 08/25/11 (from the past 24 hour(s))  CBC     Status: Abnormal   Collection Time   08/25/11 12:00 PM      Component Value Range   WBC 8.4  4.0 - 10.5 K/uL   RBC 3.99  3.87 - 5.11 MIL/uL   Hemoglobin 13.1  12.0 - 15.0 g/dL   HCT 16.1  09.6 - 04.5 %   MCV 93.2  78.0 - 100.0 fL   MCH 32.8  26.0 - 34.0 pg   MCHC 35.2  30.0 - 36.0 g/dL   RDW 40.9  81.1 - 91.4 %   Platelets 139 (*) 150 - 400 K/uL  SAMPLE TO BLOOD BANK     Status: Normal   Collection Time   08/25/11 12:00 PM      Component Value Range   Blood Bank Specimen SAMPLE AVAILABLE FOR TESTING     Sample Expiration 08/28/2011     A active labor P epidural now, had discussed Rh status with Dr. Pennie Rushing on admission use O positive value, continue care Lavera Guise, CNM

## 2011-08-25 NOTE — Anesthesia Procedure Notes (Signed)
Epidural Patient location during procedure: OB Start time: 08/25/2011 2:42 PM End time: 08/25/2011 2:46 PM Reason for block: procedure for pain  Staffing Anesthesiologist: Sandrea Hughs Performed by: anesthesiologist   Preanesthetic Checklist Completed: patient identified, site marked, surgical consent, pre-op evaluation, timeout performed, IV checked, risks and benefits discussed and monitors and equipment checked  Epidural Patient position: left lateral decubitus Prep: site prepped and draped and DuraPrep Patient monitoring: continuous pulse ox and blood pressure Approach: midline Injection technique: LOR air  Needle:  Needle type: Tuohy  Needle gauge: 17 G Needle length: 9 cm Needle insertion depth: 5 cm cm Catheter type: closed end flexible Catheter size: 19 Gauge Catheter at skin depth: 10 cm Test dose: negative and Other  Assessment Sensory level: T10 Events: blood not aspirated, injection not painful, no injection resistance, negative IV test and no paresthesia

## 2011-08-25 NOTE — Anesthesia Preprocedure Evaluation (Signed)

## 2011-08-25 NOTE — H&P (Signed)
Tiffany Tapia is a 26 y.o. female presenting for c/o of uc all night, denies srom or vag bleeding, with +FM. History OB History    Grav Para Term Preterm Abortions TAB SAB Ect Mult Living   2 1 1  0 0 0 0 0 0 1    611 SVD 38 37 weeks 7#9 pyel at 30 weeks, uncomplicated delivery  Past Medical History  Diagnosis Date  . Urinary tract infection   . Normal pregnancy, incidental 07/04/2011    Transfer of care 16 weeks   . H/O candidiasis   . GBS carrier   . H/O varicella   . H/O pyelonephritis during pregnancy     At 20weeks  . Thrombocytopenia   . Spotting during pregnancy     Second trimester    Past Surgical History  Procedure Date  . No past surgeries    Family History: family history includes Diabetes in her maternal grandmother.  There is no history of Anesthesia problems. Social History:  reports that she quit smoking about 7 months ago. Her smoking use included Cigarettes. She has never used smokeless tobacco. She reports that she does not drink alcohol or use illicit drugs.   Prenatal Transfer Tool  Maternal Diabetes:  Genetic Screening:  Maternal Ultrasounds/Referrals:  Fetal Ultrasounds or other Referrals:   Maternal Substance Abuse:   Significant Maternal Medications:   Significant Maternal Lab Results:   Other Comments:    ROS  Dilation: 4 Effacement (%): 90 Exam by:: Judie Petit Krebebach  CNM Blood pressure 129/84, pulse 79, temperature 97 F (36.1 C), temperature source Oral, resp. rate 16, height 5\' 7"  (1.702 m), weight 178 lb 3.2 oz (80.831 kg), last menstrual period 11/24/2010. Exam Physical Exam Calm, no distress, HEENT WNL, breasts bilaterally no dimpling, masses, or drainage lungs clear bilaterally, AP RRR, abd soft nt,no masses, not tympanic bowel sounds active, abdomen nontender, Normal hair distrubition mons pubis,  EGBUS WNL,  No edema to lower extremities fhts 135 LTV min uc q 3-4 mod Vag above Intact, vertex  Prenatal labs: ABO, Rh: O/POS/--  (04/17 1756) Antibody:  positive Rubella: Immune (11/20 0000) RPR: Nonreactive (11/20 0000)  HBsAg: Negative (11/20 0000)  HIV: Non-reactive (11/20 0000)  GBS: NEGATIVE (05/29 1449)  Gc/chl neg/neg US anatomy WNL QUAD neg Assessment/Plan: 39 17 week IUP Active labor Routine admission, considering epidural, collaboration with Dr. Pennie Rushing per telephone.   Shi Blankenship 08/25/2011, 12:05 PM

## 2011-08-25 NOTE — Progress Notes (Signed)
Comfortable with epidural agrees with ARM O VSS      fhts category 1      abd soft between uc      Contractions 2-3 mod      EFW 7#12      Vag 7100 -1 arm mod amount clear fluid A active labor P continue care Lavera Guise, CNM

## 2011-08-25 NOTE — MAU Note (Signed)
Pt reports having ctx q 5 min since this morning. Denies SROM or bleeding.

## 2011-08-26 LAB — CBC
HCT: 35.3 % — ABNORMAL LOW (ref 36.0–46.0)
Hemoglobin: 12 g/dL (ref 12.0–15.0)
MCH: 32.3 pg (ref 26.0–34.0)
MCHC: 34 g/dL (ref 30.0–36.0)
MCV: 94.9 fL (ref 78.0–100.0)
RBC: 3.72 MIL/uL — ABNORMAL LOW (ref 3.87–5.11)

## 2011-08-26 LAB — ABO/RH: ABO/RH(D): O POS

## 2011-08-26 MED ORDER — IBUPROFEN 600 MG PO TABS: 600.0000 mg | ORAL_TABLET | Freq: Four times a day (QID) | ORAL | Status: AC | PRN

## 2011-08-26 MED ORDER — OXYCODONE-ACETAMINOPHEN 5-325 MG PO TABS: 1.0000 | ORAL_TABLET | ORAL | Status: AC | PRN

## 2011-08-26 NOTE — Anesthesia Postprocedure Evaluation (Signed)
Anesthesia Post Note  Patient: Tiffany Tapia  Procedure(s) Performed: * No procedures listed *  Anesthesia type: Epidural  Patient location: Mother/Baby  Post pain: Pain level controlled  Post assessment: Post-op Vital signs reviewed  Last Vitals:  Filed Vitals:   08/26/11 0745  BP: 114/76  Pulse: 75  Temp: 36.7 C  Resp: 18    Post vital signs: Reviewed  Level of consciousness: awake  Complications: No apparent anesthesia complications

## 2011-08-26 NOTE — Discharge Summary (Signed)
Obstetric Discharge Summary Reason for Admission: onset of labor Prenatal Procedures: NST and ultrasound Intrapartum Procedures: spontaneous vaginal delivery Postpartum Procedures: none Complications-Operative and Postpartum: 2nd degree vaginal laceration Hemoglobin  Date Value Range Status  08/26/2011 12.0  12.0 - 15.0 g/dL Final     HCT  Date Value Range Status  08/26/2011 35.3* 36.0 - 46.0 % Final   Hospital Course: Admitted in labor on 08/25/11. Negative GBS. Progressed well, with epidural placed. Delivery was performed by Lavera Guise, CNM, on 08/25/11, without complication. Patient and baby tolerated the procedure without difficulty, with  2nd degree vaginal laceration noted. Infant to FTN. Mother and infant then had an uncomplicated postpartum course, with breast feeding going well. Patient declined circumcision and requested early discharge after 24 hours.  Mom's physical exam was WNL, and she was discharged home in stable condition on the evening of 08/26/11. Contraception plan was either Mirena or Nexplanon, between which she will decide by 6 week visit.  She received adequate benefit from po pain medications and was sent home with Motrin and Percocet.      Physical Exam:  General: alert Lochia: appropriate Uterine Fundus: firm Incision: healing well DVT Evaluation: No evidence of DVT seen on physical exam. Negative Homan's sign.  Discharge Diagnoses: Term Pregnancy-delivered  Discharge Information: Date: 08/26/2011 Activity: Per [facility] handout Diet: routine Medications: Ibuprofen and Percocet Condition: stable Instructions: refer to practice specific booklet Discharge to: home Contraception:  Will decide between IUD and Nexplanon by 6 week visit. Follow-up Information    Follow up with [facility] in 6 weeks. (Call to schedule 6 week appointment and as neede for any questions or concerns)          Newborn Data: Live born female  Birth Weight: 7 lb 4.1 oz (3290 g) APGAR:  8, 8  Home with mother.  Zara Wendt 08/26/2011, 8:03 AM

## 2011-08-26 NOTE — Progress Notes (Signed)
Ur chart review completed.  

## 2011-08-26 NOTE — Discharge Instructions (Signed)

## 2011-09-08 IMAGING — US US FETAL BPP W/O NONSTRESS
1 series · 14 of 28 positions shown · non-contrast
Comparison: none

OBSTETRICAL ULTRASOUND:
 This ultrasound exam was performed in the [HOSPITAL] Ultrasound Department.  The OB US report was generated in the AS system, and faxed to the ordering physician.  This report is also available in [HOSPITAL]?s Access[employer]ware and in [REDACTED] PACS.

[Series 1: us ob comp +14 wk · 45 acquisitions, 14 frames shown]
[im 2/45]
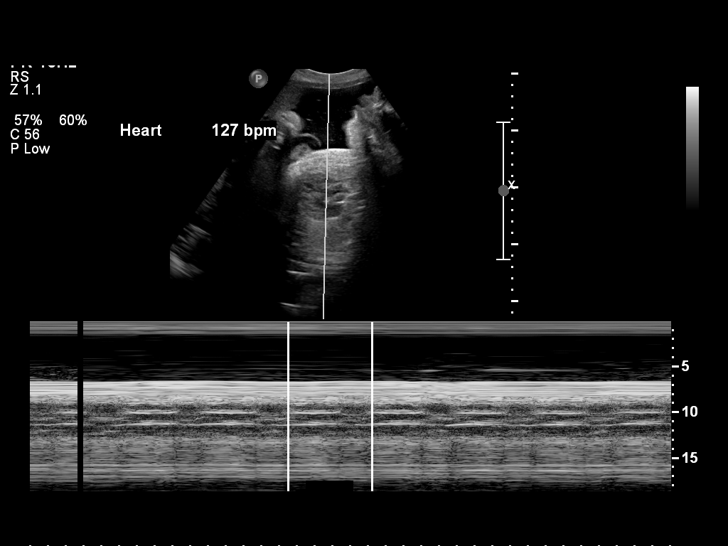
[im 5/45]
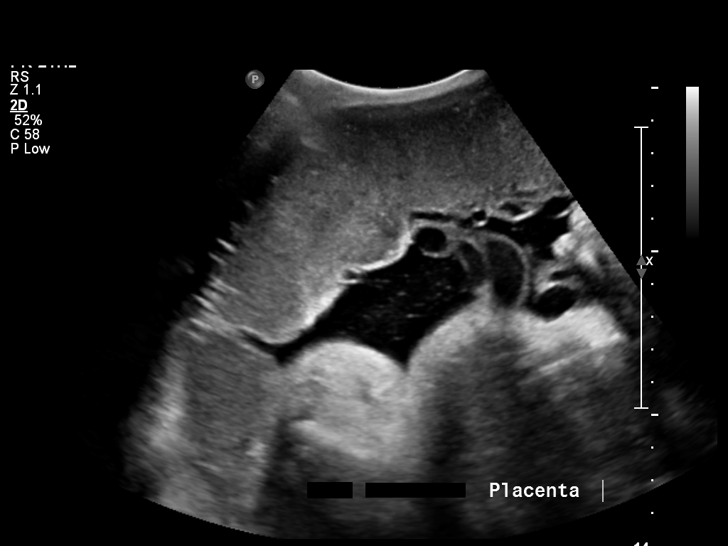
[im 9/45]
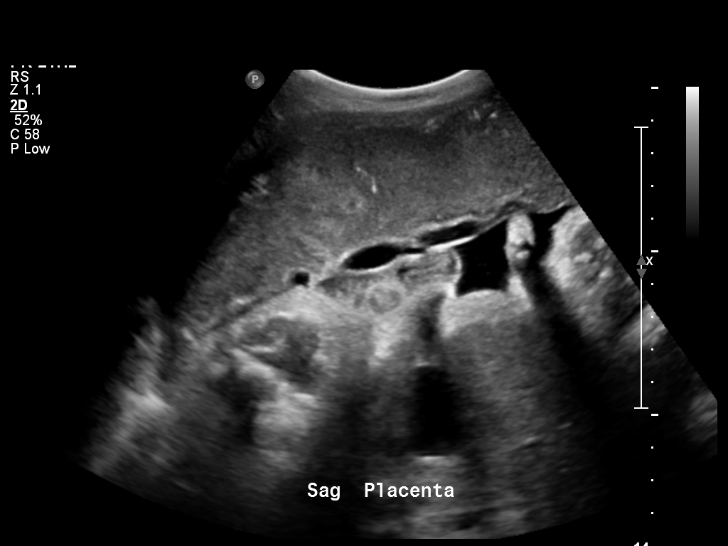
[im 12/45]
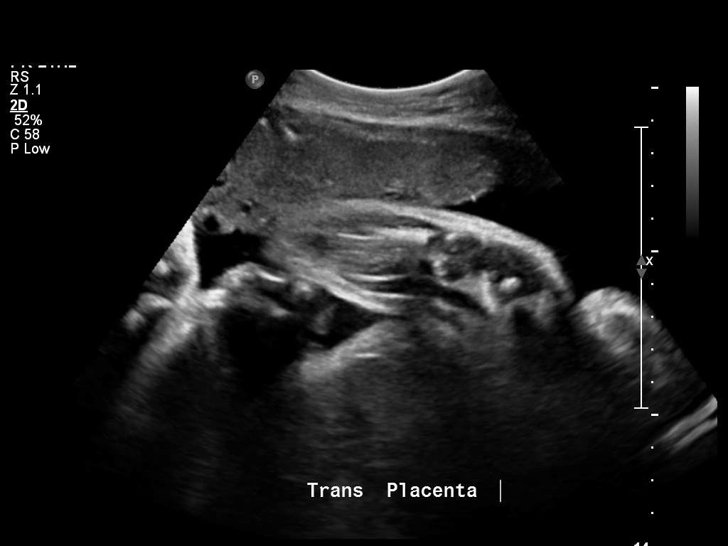
[im 15/45]
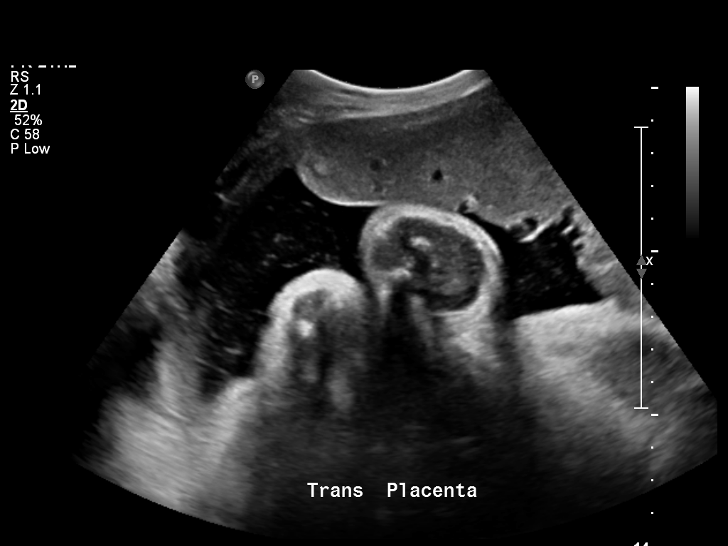
[im 18/45]
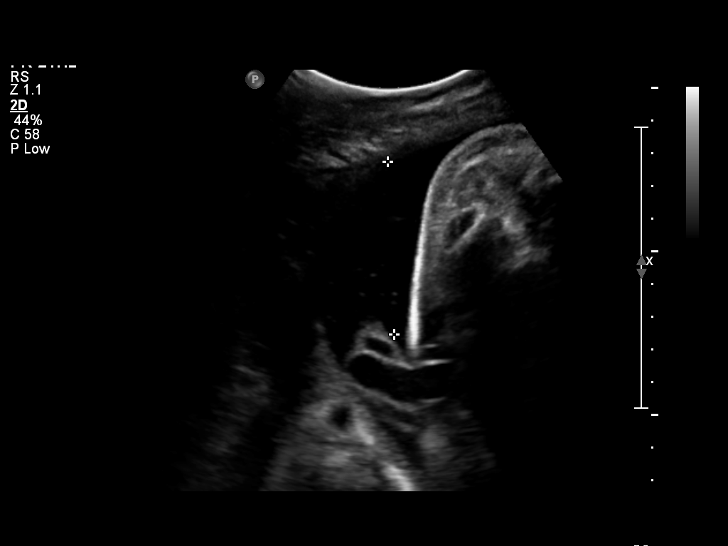
[im 22/45]
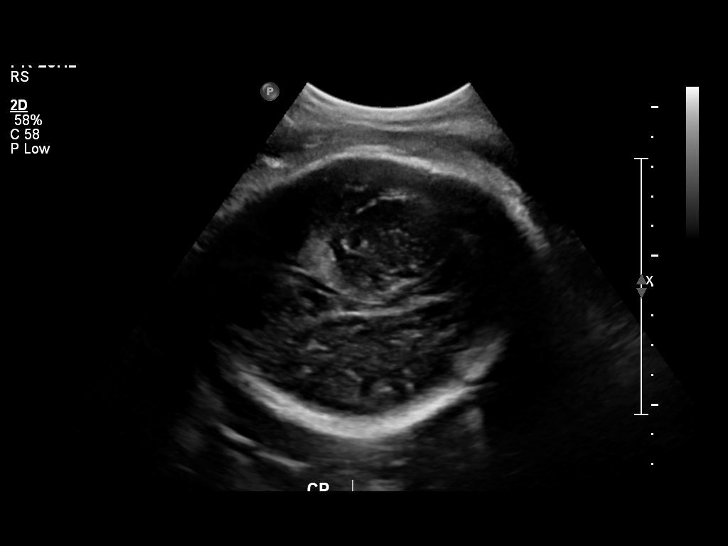
[im 25/45]
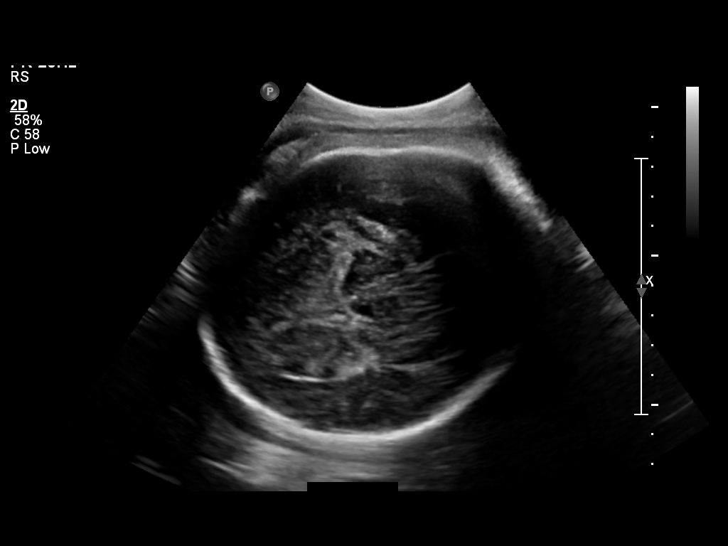
[im 28/45]
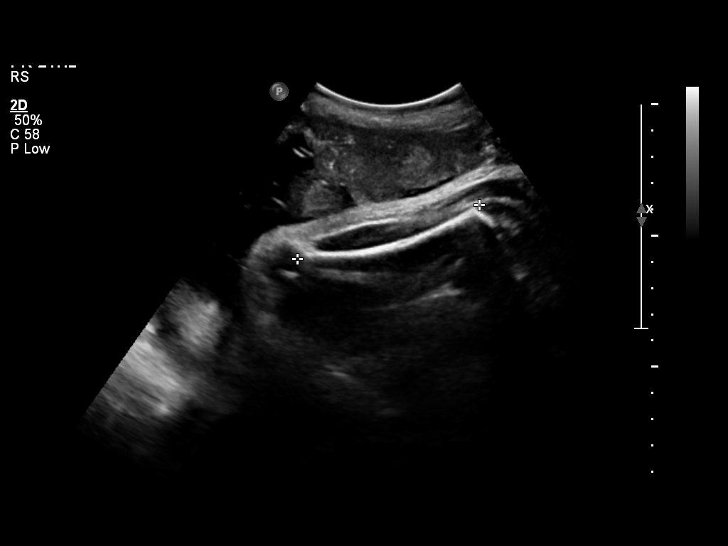
[im 31/45]
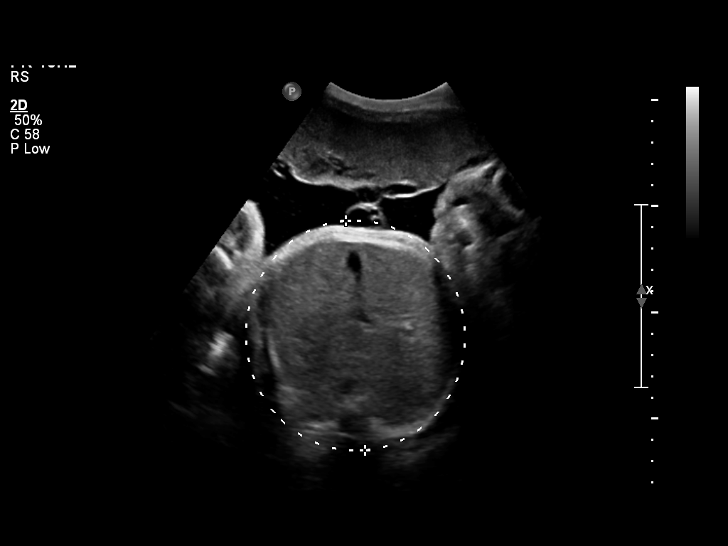
[im 35/45]
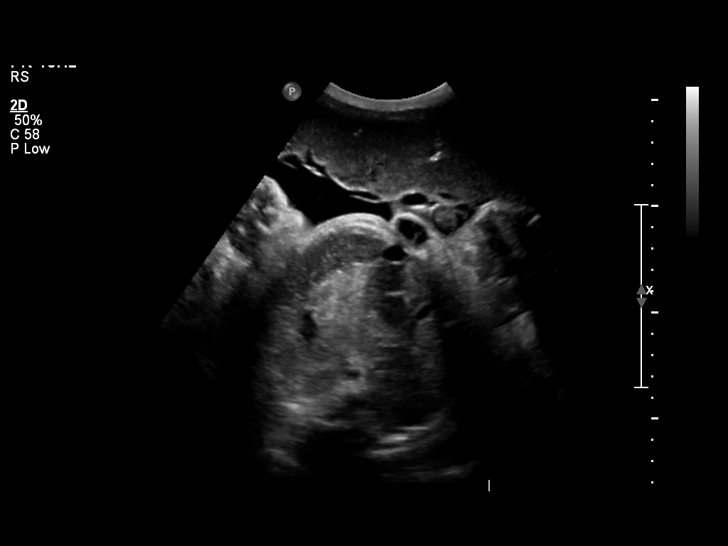
[im 38/45]
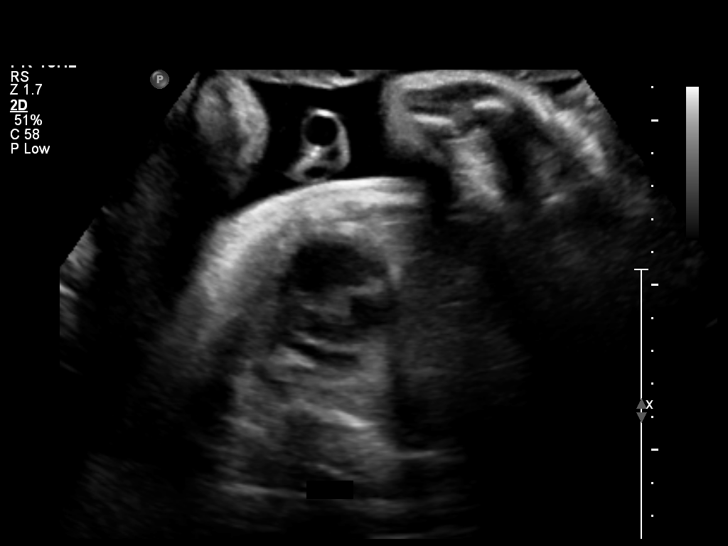
[im 41/45]
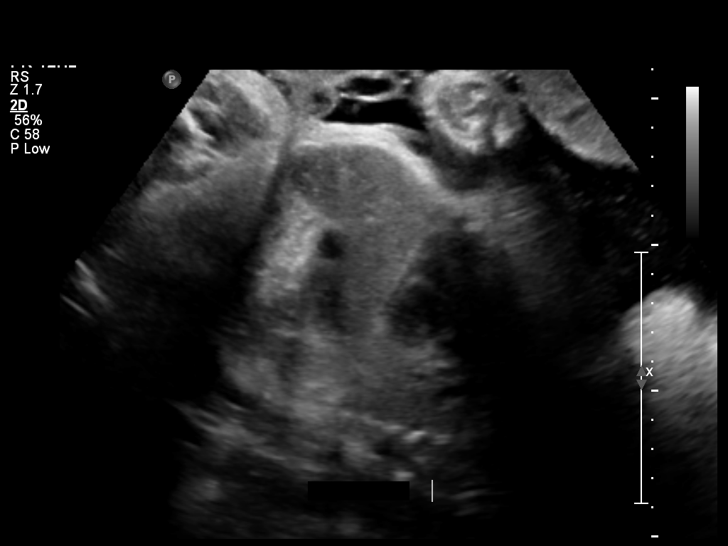
[im 45/45]
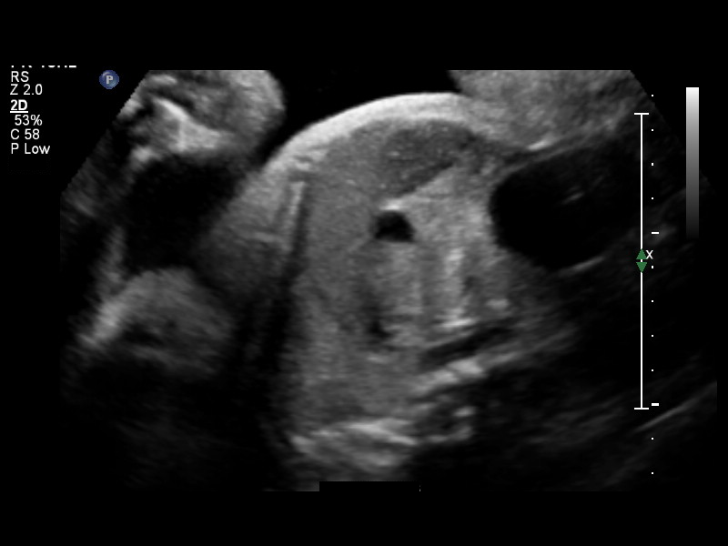

[14 of 28 positions shown; findings below may reference images not displayed]

IMPRESSION: See AS Obstetric US report.

## 2011-09-09 ENCOUNTER — Telehealth: Payer: Self-pay | Admitting: Obstetrics and Gynecology

## 2011-09-09 NOTE — Telephone Encounter (Signed)
Triage/rx °

## 2011-09-10 ENCOUNTER — Telehealth: Payer: Self-pay

## 2011-09-10 NOTE — Telephone Encounter (Signed)
Pt requests rx called in to her pharmacy to treat UTI. Advised pt that she must come in for an appointment to see a provider or be put on lab schedule to leave a clean catch urine sample in order to have something called in. Pt states she is unsure what time she will be able to come in today to leave urine sample. Advised pt to call back when she will know a specific time that she can come in order to be put on the lab schedule. Pt voiced understanding.

## 2011-09-18 ENCOUNTER — Encounter: Payer: Self-pay | Admitting: Obstetrics and Gynecology

## 2011-09-18 ENCOUNTER — Ambulatory Visit (INDEPENDENT_AMBULATORY_CARE_PROVIDER_SITE_OTHER): Payer: Medicaid Other | Admitting: Obstetrics and Gynecology

## 2011-09-18 VITALS — BP 100/70 | Temp 98.6°F | Wt 154.0 lb

## 2011-09-18 DIAGNOSIS — N390 Urinary tract infection, site not specified: Secondary | ICD-10-CM

## 2011-09-18 LAB — POCT URINALYSIS DIPSTICK
Glucose, UA: NEGATIVE
Spec Grav, UA: 1.02
Urobilinogen, UA: NEGATIVE

## 2011-09-18 MED ORDER — NITROFURANTOIN MONOHYD MACRO 100 MG PO CAPS: 100.0000 mg | ORAL_CAPSULE | Freq: Two times a day (BID) | ORAL | Status: AC

## 2011-09-18 MED ORDER — CIPROFLOXACIN HCL 500 MG PO TABS: 500.0000 mg | ORAL_TABLET | Freq: Two times a day (BID) | ORAL | Status: DC

## 2011-09-18 NOTE — Progress Notes (Signed)
Vag. Discharge:no Odor:no Fever:no Irreg.Periods:no Dyspareunia:no Dysuria:yes Frequency:no Urgency:yes Hematuria:no Kidney stones:no Constipation:no Diarrhea:no Rectal Bleeding: no Vomiting:no Nausea:no Pregnant:no PT DELIVERED ON June 23,2013 (MARY K) BABY BOY ELIJAH Fibroids:no Endometriosis:no Hx of Ovarian Cyst:no Hx IUD:no Hx STD-PID:no Appendectomy:no Gall Bladder Dz:no

## 2011-09-18 NOTE — Progress Notes (Signed)
26 YO S/P SVD    08/25/11  complains of urinary hesitancy  with pain accompanied by a strong odor x 2 weeks.  Denies flank pain, fever, N/V/D or hematuria.  O: Abdomen: soft with mild suprapubic tenderness; no CVA tenderness       U/A-pH-5.0, SG 1.020, leukocytes 4+, nitrite +, blood-trace otherwise negative  A: UTI     Lactating  P: Macrobid 100 mg # 14 bid pc x 7 days      Urine for culture-pending      RTO-as scheduled  Jordani Nunn, PA-C

## 2011-09-18 NOTE — Progress Notes (Signed)
Dr. Su Hilt, NST was for you to to sign off on thru Epic. Not sure how that's done by the providers thru epic. I'm sure you signed the actual paper strip. Tiffany Tapia

## 2011-09-19 ENCOUNTER — Telehealth: Payer: Self-pay | Admitting: Obstetrics and Gynecology

## 2011-09-19 NOTE — Telephone Encounter (Signed)
Pt called, states was given Macrobid and Cipro by pharmacist and baby's pediatrician informed pt Cipro not recommended for breastfeeding.  Pt informed after reviewing med list, only Macrobid was rx'd not Cipro, therefore must be pharmacist error, Macrobid is safe, pt voices understanding, will not take Cipro.

## 2011-09-19 NOTE — Telephone Encounter (Signed)
Triage/epic 

## 2011-09-20 LAB — URINE CULTURE: Colony Count: >=100000

## 2011-10-02 ENCOUNTER — Ambulatory Visit (INDEPENDENT_AMBULATORY_CARE_PROVIDER_SITE_OTHER): Payer: Medicaid Other | Admitting: Obstetrics and Gynecology

## 2011-10-02 ENCOUNTER — Encounter: Payer: Self-pay | Admitting: Obstetrics and Gynecology

## 2011-10-02 VITALS — BP 120/62 | Ht 68.0 in | Wt 158.0 lb

## 2011-10-02 DIAGNOSIS — M549 Dorsalgia, unspecified: Secondary | ICD-10-CM

## 2011-10-02 MED ORDER — CAMPHOR-MENTHOL-METHYL SAL 1.2-5.7-6.3 % EX PTCH: 1.0000 | MEDICATED_PATCH | CUTANEOUS | Status: DC | PRN

## 2011-10-02 MED ORDER — OXYCODONE-ACETAMINOPHEN 5-325 MG PO TABS: 1.0000 | ORAL_TABLET | Freq: Four times a day (QID) | ORAL | Status: AC | PRN

## 2011-10-02 NOTE — Progress Notes (Signed)
POSTPARTUM VISIT  Date of delivery: 08/25/2011 Female Name: [facility] Vaginal delivery:yes Cesarean section:no Tubal ligation:no GDM:no Breast Feeding:yes Bottle Feeding:no Post-Partum Blues:no Abnormal pap:no Normal GU function: yes Normal GI function:yes Returning to work:no EPDS: 3 Subjective:     Tiffany Tapia is a 26 y.o. female who presents for a postpartum visit.  I have fully reviewed the prenatal and intrapartum course.    Patient is not sexually active.   The following portions of the patient's history were reviewed and updated as appropriate: allergies, current medications, past family history, past medical history, past social history, past surgical history and problem list.  Review of Systems Pertinent items are noted in HPI.   Objective:    BP 120/62  Ht 5\' 8"  (1.727 m)  Wt 158 lb (71.668 kg)  BMI 24.02 kg/m2  Breastfeeding? Yes  General:  alert, cooperative and no distress     Lungs: clear to auscultation bilaterally  Heart:  regular rate and rhythm, S1, S2 normal, no murmur  Abdomen: soft, non-tender; bowel sounds normal; no masses,  no organomegaly   Vulva:  normal  Vagina: normal vagina  Cervix:  normal  Corpus: normal size, contour, position, consistency, mobility, non-tender  Adnexa:  normal adnexa             Assessment:     Normal postpartum exam.  Pap smear not done at today's visit.  Muskuloskeletal right flank pain  Plan:   followup 12/2011 for aex Salon Pas, Ibuprofen, and prn Percocet for back pain PT referral Will  precert Mirena and Nexplanon until pt decides which she wants for [facility]   Tiffany Tapia P MD 10/02/2011 2:50 PM   Pt c/o of lower back pain

## 2014-01-03 ENCOUNTER — Encounter: Payer: Self-pay | Admitting: Obstetrics and Gynecology

## 2014-10-17 ENCOUNTER — Emergency Department (HOSPITAL_COMMUNITY)
Admission: EM | Admit: 2014-10-17 | Discharge: 2014-10-17 | Disposition: A | Discharge status: Home / Self Care | Payer: Medicaid Other | Attending: Emergency Medicine | Admitting: Emergency Medicine

## 2014-10-17 ENCOUNTER — Emergency Department (HOSPITAL_COMMUNITY): Payer: Medicaid Other

## 2014-10-17 ENCOUNTER — Encounter (HOSPITAL_COMMUNITY): Payer: Self-pay | Admitting: Emergency Medicine

## 2014-10-17 DIAGNOSIS — Z79899 Other long term (current) drug therapy: Secondary | ICD-10-CM | POA: Insufficient documentation

## 2014-10-17 DIAGNOSIS — Z3A01 Less than 8 weeks gestation of pregnancy: Secondary | ICD-10-CM | POA: Diagnosis not present

## 2014-10-17 DIAGNOSIS — Z87440 Personal history of urinary (tract) infections: Secondary | ICD-10-CM | POA: Insufficient documentation

## 2014-10-17 DIAGNOSIS — R109 Unspecified abdominal pain: Secondary | ICD-10-CM | POA: Insufficient documentation

## 2014-10-17 DIAGNOSIS — R102 Pelvic and perineal pain unspecified side: Secondary | ICD-10-CM

## 2014-10-17 DIAGNOSIS — O368210 Fetal anemia and thrombocytopenia, first trimester, not applicable or unspecified: Secondary | ICD-10-CM | POA: Diagnosis not present

## 2014-10-17 DIAGNOSIS — O9989 Other specified diseases and conditions complicating pregnancy, childbirth and the puerperium: Secondary | ICD-10-CM | POA: Diagnosis not present

## 2014-10-17 DIAGNOSIS — Z87891 Personal history of nicotine dependence: Secondary | ICD-10-CM | POA: Insufficient documentation

## 2014-10-17 DIAGNOSIS — Z3490 Encounter for supervision of normal pregnancy, unspecified, unspecified trimester: Secondary | ICD-10-CM

## 2014-10-17 LAB — URINALYSIS, ROUTINE W REFLEX MICROSCOPIC
Bilirubin Urine: NEGATIVE
Glucose, UA: NEGATIVE mg/dL
Ketones, ur: NEGATIVE mg/dL
Nitrite: POSITIVE — AB
Protein, ur: NEGATIVE mg/dL
Specific Gravity, Urine: 1.025 (ref 1.005–1.030)
Urobilinogen, UA: 0.2 mg/dL (ref 0.0–1.0)
pH: 6.5 (ref 5.0–8.0)

## 2014-10-17 LAB — I-STAT CHEM 8, ED
BUN: 12 mg/dL (ref 6–20)
Calcium, Ion: 1.18 mmol/L (ref 1.12–1.23)
Chloride: 104 mmol/L (ref 101–111)
Creatinine, Ser: 0.7 mg/dL (ref 0.44–1.00)
Glucose, Bld: 99 mg/dL (ref 65–99)
HCT: 38 % (ref 36.0–46.0)
Hemoglobin: 12.9 g/dL (ref 12.0–15.0)
Potassium: 3.9 mmol/L (ref 3.5–5.1)
Sodium: 137 mmol/L (ref 135–145)
TCO2: 21 mmol/L (ref 0–100)

## 2014-10-17 LAB — URINE MICROSCOPIC-ADD ON

## 2014-10-17 LAB — HCG, QUANTITATIVE, PREGNANCY: hCG, Beta Chain, Quant, S: 42410 m[IU]/mL — ABNORMAL HIGH (ref <5)

## 2014-10-17 MED ORDER — CEPHALEXIN 500 MG PO CAPS: 1000.0000 mg | ORAL_CAPSULE | Freq: Two times a day (BID) | ORAL | Status: DC

## 2014-10-17 NOTE — ED Provider Notes (Signed)
CSN: [id number]     Arrival date & time 10/17/14  4098 History   First MD Initiated Contact with Patient 10/17/14 0820     Chief Complaint  Patient presents with  . Abdominal Pain     (Consider location/radiation/quality/duration/timing/severity/associated sxs/prior Treatment) HPI   Patient is a 29 year old female with PMHx of UTI, thrombocytopenia, and pyelonephritis who presents to the ED with abdominal pain and cramping.  Patient states she believes she is [redacted] weeks pregnant.  She states that she had similar cramping in her two previous pregnancies through week 8.  She states the pain has been intermittent, seems to be worse at night, and improves with warm showers.  She has not tried any pain medication for her symptoms.  She denies fever, chills, chest pain, shortness of breath, dysuria, or constipation.   Past Medical History  Diagnosis Date  . Urinary tract infection   . Normal pregnancy, incidental 07/04/2011    Transfer of care 16 weeks   . H/O candidiasis   . GBS carrier   . H/O varicella   . H/O pyelonephritis during pregnancy     At 20weeks  . Thrombocytopenia   . Spotting during pregnancy     Second trimester    Past Surgical History  Procedure Laterality Date  . No past surgeries     Family History  Problem Relation Age of Onset  . Anesthesia problems Neg Hx   . Diabetes Maternal Grandmother   . Anemia Mother    Social History  Substance Use Topics  . Smoking status: Former Smoker    Types: Cigarettes    Quit date: 12/25/2010  . Smokeless tobacco: Never Used  . Alcohol Use: No   OB History    Gravida Para Term Preterm AB TAB SAB Ectopic Multiple Living   3 2 2  0 0 0 0 0 0 2     Review of Systems All other systems negative except as documented in the HPI. All pertinent positives and negatives as reviewed in the HPI.   Allergies  Review of patient's allergies indicates no known allergies.  Home Medications   Prior to Admission medications    Medication Sig Start Date End Date Taking? Authorizing Provider  Prenatal Vit-Fe Fumarate-FA (PRENATAL MULTIVITAMIN) TABS Take 1 tablet by mouth daily. 06/25/11  Yes Kirkland Hun, MD  Camphor-Menthol-Methyl Sal (SALONPAS) 1.2-5.7-6.3 % PTCH Apply 1 patch topically every 4 (four) hours as needed. Patient not taking: Reported on 10/17/2014 10/02/11   Hal Morales, MD   BP 108/72 mmHg  Pulse 70  Temp(Src) 99 F (37.2 C) (Oral)  Resp 18  Ht 5\' 7"  (1.702 m)  Wt 135 lb (61.236 kg)  BMI 21.14 kg/m2  SpO2 100% Physical Exam  Constitutional: She is oriented to person, place, and time. She appears well-developed and well-nourished. No distress.  HENT:  Head: Normocephalic and atraumatic.  Cardiovascular: Normal rate, regular rhythm and normal heart sounds.  Exam reveals no gallop and no friction rub.   No murmur heard. Pulmonary/Chest: Effort normal and breath sounds normal. No respiratory distress. She has no wheezes. She has no rales.  Abdominal: Soft. Bowel sounds are normal. She exhibits no distension. There is no tenderness (non-tender to palpation).  Neurological: She is alert and oriented to person, place, and time.  Skin: Skin is warm and dry. She is not diaphoretic.  Psychiatric: She has a normal mood and affect. Her behavior is normal.    ED Course  Procedures (including critical  care time) Labs Review Labs Reviewed  HCG, QUANTITATIVE, PREGNANCY - Abnormal; Notable for the following:    hCG, Beta Chain, Quant, S 42410 (*)    All other components within normal limits  URINALYSIS, ROUTINE W REFLEX MICROSCOPIC (NOT AT [facility]) - Abnormal; Notable for the following:    APPearance CLOUDY (*)    Hgb urine dipstick TRACE (*)    Nitrite POSITIVE (*)    Leukocytes, UA MODERATE (*)    All other components within normal limits  URINE MICROSCOPIC-ADD ON - Abnormal; Notable for the following:    Squamous Epithelial / LPF FEW (*)    Bacteria, UA MANY (*)    All other components  within normal limits  I-STAT CHEM 8, ED    Imaging Review US Ob Comp Less 14 Wks  10/17/2014   CLINICAL DATA:  First trimester pregnancy with pelvic pain for 7 days. LMP 09/05/2014. Beta HCG level 42,410. Initial encounter.  EXAM: OBSTETRIC <14 WK [place] AND TRANSVAGINAL OB US  TECHNIQUE: Both transabdominal and transvaginal ultrasound examinations were performed for complete evaluation of the gestation as well as the maternal uterus, adnexal regions, and pelvic cul-de-sac. Transvaginal technique was performed to assess early pregnancy.  COMPARISON:  None.  FINDINGS: Intrauterine gestational sac: Visualized/normal in shape.  Yolk sac:  Visualized.  Embryo:  Visualized.  Cardiac Activity: Visualized.  Heart Rate: 110  bpm  CRL:  38  mm; 6 w 0 d;                  [place] EDC: 06/12/2015  Maternal uterus/adnexae: There is no significant subchorionic hematoma. Both maternal ovaries are visualized. No adnexal mass or significant free pelvic fluid.  IMPRESSION: 1. Single live intrauterine pregnancy with best estimated gestational age of [redacted] weeks 0 days. 2. No fetal, placental or adnexal abnormalities identified   Electronically Signed   By: Carey Bullocks M.D.   On: 10/17/2014 11:06   US Ob Transvaginal  10/17/2014   CLINICAL DATA:  First trimester pregnancy with pelvic pain for 7 days. LMP 09/05/2014. Beta HCG level 42,410. Initial encounter.  EXAM: OBSTETRIC <14 WK [place] AND TRANSVAGINAL OB US  TECHNIQUE: Both transabdominal and transvaginal ultrasound examinations were performed for complete evaluation of the gestation as well as the maternal uterus, adnexal regions, and pelvic cul-de-sac. Transvaginal technique was performed to assess early pregnancy.  COMPARISON:  None.  FINDINGS: Intrauterine gestational sac: Visualized/normal in shape.  Yolk sac:  Visualized.  Embryo:  Visualized.  Cardiac Activity: Visualized.  Heart Rate: 110  bpm  CRL:  38  mm; 6 w 0 d;                  [place] EDC: 06/12/2015  Maternal uterus/adnexae:  There is no significant subchorionic hematoma. Both maternal ovaries are visualized. No adnexal mass or significant free pelvic fluid.  IMPRESSION: 1. Single live intrauterine pregnancy with best estimated gestational age of [redacted] weeks 0 days. 2. No fetal, placental or adnexal abnormalities identified   Electronically Signed   By: Carey Bullocks M.D.   On: 10/17/2014 11:06   I, Saamiya Jeppsen W, personally reviewed and evaluated these images and lab results as part of my medical decision-making.  Patient is referred back to her OB/GYN told to return here as needed.  Patient has a single intrauterine pregnancy.  There is no signs of ectopic pregnancy at this time   Charlestine Night, PA-C 10/17/14 1548  Laurence Spates, MD 10/17/14 [phone]

## 2014-10-17 NOTE — ED Notes (Signed)
Patient states abdominal pain started on about 10/10/14.  Feels like severe cramp.  Patient states she experienced similar pain with previous two pregnancies but the pain is much more severe this time.  No apparent distress at this time.

## 2014-10-17 NOTE — Discharge Instructions (Signed)
Return here as needed.  Follow-up with your GYN doctor.  °

## 2014-10-17 NOTE — ED Notes (Signed)
Pt ambulated to the restroom with ease. 

## 2014-10-17 NOTE — ED Notes (Signed)
Pt aware of urine sample needed, pt unable to go at this time. 

## 2014-11-23 ENCOUNTER — Encounter (HOSPITAL_COMMUNITY): Payer: Self-pay | Admitting: Student

## 2014-11-23 ENCOUNTER — Inpatient Hospital Stay (HOSPITAL_COMMUNITY)
Admission: AD | Admit: 2014-11-23 | Discharge: 2014-11-23 | Disposition: A | Discharge status: Home / Self Care | Payer: Medicaid Other | Source: Ambulatory Visit | Attending: Family Medicine | Admitting: Family Medicine

## 2014-11-23 DIAGNOSIS — Z3A11 11 weeks gestation of pregnancy: Secondary | ICD-10-CM | POA: Insufficient documentation

## 2014-11-23 DIAGNOSIS — R109 Unspecified abdominal pain: Secondary | ICD-10-CM | POA: Diagnosis present

## 2014-11-23 DIAGNOSIS — B9689 Other specified bacterial agents as the cause of diseases classified elsewhere: Secondary | ICD-10-CM | POA: Diagnosis not present

## 2014-11-23 DIAGNOSIS — A499 Bacterial infection, unspecified: Secondary | ICD-10-CM

## 2014-11-23 DIAGNOSIS — N760 Acute vaginitis: Secondary | ICD-10-CM | POA: Diagnosis not present

## 2014-11-23 DIAGNOSIS — O23591 Infection of other part of genital tract in pregnancy, first trimester: Secondary | ICD-10-CM | POA: Insufficient documentation

## 2014-11-23 LAB — URINE MICROSCOPIC-ADD ON

## 2014-11-23 LAB — URINALYSIS, ROUTINE W REFLEX MICROSCOPIC
BILIRUBIN URINE: NEGATIVE
Glucose, UA: NEGATIVE mg/dL
Hgb urine dipstick: NEGATIVE
Ketones, ur: NEGATIVE mg/dL
NITRITE: NEGATIVE
PH: 8 (ref 5.0–8.0)
Protein, ur: NEGATIVE mg/dL
SPECIFIC GRAVITY, URINE: 1.01 (ref 1.005–1.030)
Urobilinogen, UA: 0.2 mg/dL (ref 0.0–1.0)

## 2014-11-23 LAB — CBC
HCT: 32.2 % — ABNORMAL LOW (ref 36.0–46.0)
Hemoglobin: 11.1 g/dL — ABNORMAL LOW (ref 12.0–15.0)
MCH: 31.4 pg (ref 26.0–34.0)
MCHC: 34.5 g/dL (ref 30.0–36.0)
MCV: 91 fL (ref 78.0–100.0)
PLATELETS: 148 10*3/uL — AB (ref 150–400)
RBC: 3.54 MIL/uL — AB (ref 3.87–5.11)
RDW: 12.6 % (ref 11.5–15.5)
WBC: 7 10*3/uL (ref 4.0–10.5)

## 2014-11-23 LAB — WET PREP, GENITAL
TRICH WET PREP: NONE SEEN
Yeast Wet Prep HPF POC: NONE SEEN

## 2014-11-23 MED ORDER — METRONIDAZOLE 500 MG PO TABS: 500.0000 mg | ORAL_TABLET | Freq: Two times a day (BID) | ORAL | Status: DC

## 2014-11-23 NOTE — MAU Provider Note (Signed)
History     CSN: [id number]  Arrival date and time: 11/23/14 2952   First Provider Initiated Contact with Patient 11/23/14 [phone]         Chief Complaint  Patient presents with  . Abdominal Pain   HPI  Tiffany Tapia is a 29 y.o. G3P2002 at [redacted]w[redacted]d who presents for abdominal cramping.  Abdominal cramping x 1 month. No change today. Rates pain 2/10. Uses warm compresses to treat with mild relief. No meds.  Some nausea. Denies vomiting or diarrhea.  Reports constipation since taking prenatal vitamins. Last BM 2 days ago. Not treating constipation.  Denies vaginal bleeding, vaginal discharge, or urinary complaints.  Waiting on Medicaid to start prenatal care.    OB History    Gravida Para Term Preterm AB TAB SAB Ectopic Multiple Living   0 0 0 0 0 0 2      Past Medical History  Diagnosis Date  . Urinary tract infection   . Normal pregnancy, incidental 07/04/2011    Transfer of care 16 weeks   . H/O candidiasis   . GBS carrier   . H/O varicella   . H/O pyelonephritis during pregnancy     At 20weeks  . Thrombocytopenia   . Spotting during pregnancy     Second trimester     Past Surgical History  Procedure Laterality Date  . No past surgeries      Family History  Problem Relation Age of Onset  . Anesthesia problems Neg Hx   . Diabetes Maternal Grandmother   . Anemia Mother     Social History  Substance Use Topics  . Smoking status: Former Smoker    Types: Cigarettes    Quit date: 12/25/2010  . Smokeless tobacco: Never Used  . Alcohol Use: No    Allergies: No Known Allergies  Prescriptions prior to admission  Medication Sig Dispense Refill Last Dose  . Prenatal Vit-Fe Fumarate-FA (PRENATAL MULTIVITAMIN) TABS Take 1 tablet by mouth daily. 30 tablet 11 11/22/2014 at Unknown time  . Camphor-Menthol-Methyl Sal (SALONPAS) 1.2-5.7-6.3 % PTCH Apply 1 patch topically every 4 (four) hours as needed. (Patient not taking: Reported on 10/17/2014) 20 patch 3   .  cephALEXin (KEFLEX) 500 MG capsule Take 2 capsules (1,000 mg total) by mouth 2 (two) times daily. (Patient not taking: Reported on 11/23/2014) 28 capsule 0     Review of Systems  Constitutional: Negative.   Gastrointestinal: Positive for nausea, abdominal pain and constipation. Negative for vomiting and diarrhea.  Genitourinary: Negative.  Negative for dysuria.       No vaginal bleeding   I have reviewed the past Medical Hx, Surgical Hx, Social Hx, Allergies and Medications.   Physical Exam   Blood pressure 114/78, pulse 66, temperature 98.3 F (36.8 C), temperature source Oral, resp. rate 16, currently breastfeeding.  Physical Exam  Nursing note and vitals reviewed. Constitutional: She is oriented to person, place, and time. She appears well-developed and well-nourished. No distress.  HENT:  Head: Normocephalic and atraumatic.  Eyes: Conjunctivae are normal. Right eye exhibits no discharge. Left eye exhibits no discharge. No scleral icterus.  Neck: Normal range of motion.  Cardiovascular: Normal rate, regular rhythm and normal heart sounds.   No murmur heard. Respiratory: Effort normal and breath sounds normal. No respiratory distress. She has no wheezes.  GI: Soft. Bowel sounds are normal. She exhibits no distension. There is no tenderness.  Genitourinary: Vagina normal. Uterus is enlarged. Cervix exhibits discharge (small amount  of thin off white discharge). Cervix exhibits no motion tenderness and no friability.  Cervix closed  Neurological: She is alert and oriented to person, place, and time.  Skin: Skin is warm and dry. She is not diaphoretic.  Psychiatric: She has a normal mood and affect. Her behavior is normal. Judgment and thought content normal.   FHT 156 by doppler  MAU Course  Procedures Results for orders placed or performed during the hospital encounter of 11/23/14 (from the past 24 hour(s))  CBC     Status: Abnormal   Collection Time: 11/23/14  9:50 AM  Result  Value Ref Range   WBC 7.0 4.0 - 10.5 K/uL   RBC 3.54 (L) 3.87 - 5.11 MIL/uL   Hemoglobin 11.1 (L) 12.0 - 15.0 g/dL   HCT 16.1 (L) 09.6 - 04.5 %   MCV 91.0 78.0 - 100.0 fL   MCH 31.4 26.0 - 34.0 pg   MCHC 34.5 30.0 - 36.0 g/dL   RDW 40.9 81.1 - 91.4 %   Platelets 148 (L) 150 - 400 K/uL  Wet prep, genital     Status: Abnormal   Collection Time: 11/23/14 10:15 AM  Result Value Ref Range   Yeast Wet Prep HPF POC NONE SEEN NONE SEEN   Trich, Wet Prep NONE SEEN NONE SEEN   Clue Cells Wet Prep HPF POC MODERATE (A) NONE SEEN   WBC, Wet Prep HPF POC TOO NUMEROUS TO COUNT (A) NONE SEEN  Urinalysis, Routine w reflex microscopic (not at [facility])     Status: Abnormal   Collection Time: 11/23/14 10:18 AM  Result Value Ref Range   Color, Urine YELLOW YELLOW   APPearance CLEAR CLEAR   Specific Gravity, Urine 1.010 1.005 - 1.030   pH 8.0 5.0 - 8.0   Glucose, UA NEGATIVE NEGATIVE mg/dL   Hgb urine dipstick NEGATIVE NEGATIVE   Bilirubin Urine NEGATIVE NEGATIVE   Ketones, ur NEGATIVE NEGATIVE mg/dL   Protein, ur NEGATIVE NEGATIVE mg/dL   Urobilinogen, UA 0.2 0.0 - 1.0 mg/dL   Nitrite NEGATIVE NEGATIVE   Leukocytes, UA SMALL (A) NEGATIVE  Urine microscopic-add on     Status: None   Collection Time: 11/23/14 10:18 AM  Result Value Ref Range   Squamous Epithelial / LPF RARE RARE   WBC, UA 0-2 <3 WBC/hpf    MDM FHT 156 by doppler Will treat for BV  Assessment and Plan  A: 1. Bacterial vaginosis    P: Discharge home Start prenatal care Take stool softener & increase fluid intake Discussed reasons to return to MAU Rx flagyl  Judeth Horn, NP   11/23/2014, 9:22 AM

## 2014-11-23 NOTE — MAU Note (Signed)
C/o intermittent cramping for past 5 weeks; cramping became more constant last night "feels like my period is going to start"; no vaginal bleeding or discharge;

## 2014-11-23 NOTE — Discharge Instructions (Signed)
Bacterial Vaginosis °Bacterial vaginosis is a vaginal infection that occurs when the normal balance of bacteria in the vagina is disrupted. It results from an overgrowth of certain bacteria. This is the most common vaginal infection in women of childbearing age. Treatment is important to prevent complications, especially in pregnant women, as it can cause a premature delivery. °CAUSES  °Bacterial vaginosis is caused by an increase in harmful bacteria that are normally present in smaller amounts in the vagina. Several different kinds of bacteria can cause bacterial vaginosis. However, the reason that the condition develops is not fully understood. °RISK FACTORS °Certain activities or behaviors can put you at an increased risk of developing bacterial vaginosis, including: °· Having a new sex partner or multiple sex partners. °· Douching. °· Using an intrauterine device (IUD) for contraception. °Women do not get bacterial vaginosis from toilet seats, bedding, swimming pools, or contact with objects around them. °SIGNS AND SYMPTOMS  °Some women with bacterial vaginosis have no signs or symptoms. Common symptoms include: °· Grey vaginal discharge. °· A fishlike odor with discharge, especially after sexual intercourse. °· Itching or burning of the vagina and vulva. °· Burning or pain with urination. °DIAGNOSIS  °Your health care provider will take a medical history and examine the vagina for signs of bacterial vaginosis. A sample of vaginal fluid may be taken. Your health care provider will look at this sample under a microscope to check for bacteria and abnormal cells. A vaginal pH test may also be done.  °TREATMENT  °Bacterial vaginosis may be treated with antibiotic medicines. These may be given in the form of a pill or a vaginal cream. A second round of antibiotics may be prescribed if the condition comes back after treatment.  °HOME CARE INSTRUCTIONS  °· Only take over-the-counter or prescription medicines as  directed by your health care provider. °· If antibiotic medicine was prescribed, take it as directed. Make sure you finish it even if you start to feel better. °· Do not have sex until treatment is completed. °· Tell all sexual partners that you have a vaginal infection. They should see their health care provider and be treated if they have problems, such as a mild rash or itching. °· Practice safe sex by using condoms and only having one sex partner. °SEEK MEDICAL CARE IF:  °· Your symptoms are not improving after 3 days of treatment. °· You have increased discharge or pain. °· You have a fever. °MAKE SURE YOU:  °· Understand these instructions. °· Will watch your condition. °· Will get help right away if you are not doing well or get worse. °FOR MORE INFORMATION  °Centers for Disease Control and Prevention, Division of STD Prevention: www.cdc.gov/std °American Sexual Health Association (ASHA): www.ashastd.org  °Document Released: 02/18/2005 Document Revised: 12/09/2012 Document Reviewed: 09/30/2012 °ExitCare® Patient Information ©2015 [employer] LLC. This information is not intended to replace advice given to you by your health care provider. Make sure you discuss any questions you have with your health care provider. ° °Abdominal Pain During Pregnancy °Abdominal pain is common in pregnancy. Most of the time, it does not cause harm. There are many causes of abdominal pain. Some causes are more serious than others. Some of the causes of abdominal pain in pregnancy are easily diagnosed. Occasionally, the diagnosis takes time to understand. Other times, the cause is not determined. Abdominal pain can be a sign that something is very wrong with the pregnancy, or the pain may have nothing to do with the pregnancy   at all. For this reason, always tell your health care provider if you have any abdominal discomfort. °HOME CARE INSTRUCTIONS  °Monitor your abdominal pain for any changes. The following actions may help to  alleviate any discomfort you are experiencing: °· Do not have sexual intercourse or put anything in your vagina until your symptoms go away completely. °· Get plenty of rest until your pain improves. °· Drink clear fluids if you feel nauseous. Avoid solid food as long as you are uncomfortable or nauseous. °· Only take over-the-counter or prescription medicine as directed by your health care provider. °· Keep all follow-up appointments with your health care provider. °SEEK IMMEDIATE MEDICAL CARE IF: °· You are bleeding, leaking fluid, or passing tissue from the vagina. °· You have increasing pain or cramping. °· You have persistent vomiting. °· You have painful or bloody urination. °· You have a fever. °· You notice a decrease in your baby's movements. °· You have extreme weakness or feel faint. °· You have shortness of breath, with or without abdominal pain. °· You develop a severe headache with abdominal pain. °· You have abnormal vaginal discharge with abdominal pain. °· You have persistent diarrhea. °· You have abdominal pain that continues even after rest, or gets worse. °MAKE SURE YOU:  °· Understand these instructions. °· Will watch your condition. °· Will get help right away if you are not doing well or get worse. °Document Released: 02/18/2005 Document Revised: 12/09/2012 Document Reviewed: 09/17/2012 °ExitCare® Patient Information ©2015 [employer]. This information is not intended to replace advice given to you by your health care provider. Make sure you discuss any questions you have with your health care provider. ° °

## 2014-11-24 LAB — CULTURE, OB URINE: Culture: NO GROWTH

## 2014-11-24 LAB — GC/CHLAMYDIA PROBE AMP (~~LOC~~) NOT AT ARMC
CHLAMYDIA, DNA PROBE: NEGATIVE
Neisseria Gonorrhea: NEGATIVE

## 2014-11-24 LAB — HIV ANTIBODY (ROUTINE TESTING W REFLEX): HIV SCREEN 4TH GENERATION: NONREACTIVE

## 2014-12-07 LAB — OB RESULTS CONSOLE GC/CHLAMYDIA
Chlamydia: NEGATIVE
GC PROBE AMP, GENITAL: NEGATIVE

## 2014-12-07 LAB — OB RESULTS CONSOLE RUBELLA ANTIBODY, IGM: RUBELLA: NON-IMMUNE/NOT IMMUNE

## 2014-12-07 LAB — OB RESULTS CONSOLE ABO/RH: RH TYPE: POSITIVE

## 2014-12-07 LAB — OB RESULTS CONSOLE HEPATITIS B SURFACE ANTIGEN: HEP B S AG: NEGATIVE

## 2014-12-07 LAB — OB RESULTS CONSOLE ANTIBODY SCREEN: Antibody Screen: NEGATIVE

## 2014-12-07 LAB — OB RESULTS CONSOLE HIV ANTIBODY (ROUTINE TESTING): HIV: NONREACTIVE

## 2015-03-05 NOTE — L&D Delivery Note (Signed)
Delivery Note 0348: MAU nurse call and reports patient with increased discomfort and now 9cm.  Patient to room 173 and provider at bedside for assessment.  PE as charted on H&P, VE with BOW at introitus.  Room prepped for delivery and patient instructed on when to push.  FHR remained reassuring and patient delivered as below with staff support.    At 3:59 AM, on April 4th, 2017, a viable female "Tiffany Tapia" was delivered via Vaginal, Spontaneous Delivery (Presentation: Right Occiput Anterior with restitution to ROT).  After delivery of head, nuchal cord noted that shoulders and body was delivered through via somersault maneuver. Tactile stimulation and bulb suction given by provider and infant placed on mother's abdomen where nurses continued tactile stimulation.  Infant  APGAR: 9, 9. Cord clamped, cut, and blood collected. Placenta delivered spontaneously and noted to be intact with 3VC upon inspection.  Vaginal inspection revealed no lacerations.  Fundus firm, below the umbilicus, and bleeding scant.  Mother hemodynamically stable, but with noted elevated BP and infant skin to skin prior to provider exit.  Mother unsure of birth control method at current, but does desire.  Mother also desires to breastfeed.  Infant weight at one hour of life: 7lbs 6.3oz, 20.5in  Anesthesia: None  Episiotomy: None Lacerations: None Suture Repair: None Est. Blood Loss (mL):  100  Mom to postpartum.  Baby to Couplet care / Skin to Skin.  Cherre RobinsJessica L Montoya Brandel MSN, CNM 06/06/2015, 4:17 AM

## 2015-05-07 ENCOUNTER — Inpatient Hospital Stay (HOSPITAL_COMMUNITY)
Admission: AD | Admit: 2015-05-07 | Discharge: 2015-05-07 | Disposition: A | Discharge status: Home / Self Care | Payer: Medicaid Other | Source: Ambulatory Visit | Attending: Obstetrics and Gynecology | Admitting: Obstetrics and Gynecology

## 2015-05-07 ENCOUNTER — Encounter (HOSPITAL_COMMUNITY): Payer: Self-pay | Admitting: *Deleted

## 2015-05-07 DIAGNOSIS — Z3A34 34 weeks gestation of pregnancy: Secondary | ICD-10-CM | POA: Diagnosis not present

## 2015-05-07 DIAGNOSIS — O479 False labor, unspecified: Secondary | ICD-10-CM

## 2015-05-07 DIAGNOSIS — Z87891 Personal history of nicotine dependence: Secondary | ICD-10-CM | POA: Diagnosis not present

## 2015-05-07 DIAGNOSIS — E860 Dehydration: Secondary | ICD-10-CM | POA: Diagnosis not present

## 2015-05-07 DIAGNOSIS — R102 Pelvic and perineal pain: Secondary | ICD-10-CM | POA: Diagnosis present

## 2015-05-07 DIAGNOSIS — O26893 Other specified pregnancy related conditions, third trimester: Secondary | ICD-10-CM

## 2015-05-07 LAB — URINALYSIS, ROUTINE W REFLEX MICROSCOPIC
Bilirubin Urine: NEGATIVE
GLUCOSE, UA: NEGATIVE mg/dL
HGB URINE DIPSTICK: NEGATIVE
KETONES UR: 15 mg/dL — AB
LEUKOCYTES UA: NEGATIVE
Nitrite: NEGATIVE
PH: 5.5 (ref 5.0–8.0)
Protein, ur: NEGATIVE mg/dL
Specific Gravity, Urine: >1.03 — ABNORMAL HIGH (ref 1.005–1.030)

## 2015-05-07 MED ORDER — ACETAMINOPHEN 500 MG PO TABS
1000.0000 mg | ORAL_TABLET | Freq: Once | ORAL | Status: AC
  Administered 2015-05-07: 1000 mg via ORAL
  Filled 2015-05-07: qty 2

## 2015-05-07 NOTE — MAU Note (Signed)
Pt presents complaining of contractions and general pelvic soreness all day. States she is on her feet for work and it is making it worse. Denies bleeding or leaking. Reports good fetal movement.

## 2015-05-07 NOTE — MAU Provider Note (Signed)
History    Tiffany Tapia is a 29y.o. [id number] at 34.6wks who presents, unannounced, for pelvic pain and contractions.  Patient reports pain has been ongoing for weeks, but tonight it is causing an inability to ambulate appropriately.  Patient reports exhaustion for the pain and reports that "it feels like my pelvis is coming apart."  Patient reports wearing a belly support belt, but it does not help with pelvic pain.  However, it does provide relief to back pain. Patient denies issues with urination and reports drinking one bottle of water and two bottles (16oz) of cranberry juice.  Patient reports drinking 7-8 glasses of juice daily.  Patient reports good fetal movement and denies LOF and VB.    Patient Active Problem List   Diagnosis Date Noted  . Vaginal delivery 08/25/2011  . Normal pregnancy, incidental 07/15/2011  . Normal pregnancy, incidental 07/04/2011    Chief Complaint  Patient presents with  . Pelvic Pain  . Contractions   HPI  OB History    Gravida Para Term Preterm AB TAB SAB Ectopic Multiple Living   0 0 0 0 0 0 2      Past Medical History  Diagnosis Date  . Urinary tract infection   . Normal pregnancy, incidental 07/04/2011    Transfer of care 16 weeks   . H/O candidiasis   . GBS carrier   . H/O varicella   . H/O pyelonephritis during pregnancy     At 20weeks  . Thrombocytopenia   . Spotting during pregnancy     Second trimester     Past Surgical History  Procedure Laterality Date  . No past surgeries      Family History  Problem Relation Age of Onset  . Anesthesia problems Neg Hx   . Diabetes Maternal Grandmother   . Anemia Mother     Social History  Substance Use Topics  . Smoking status: Former Smoker    Types: Cigarettes    Quit date: 12/25/2010  . Smokeless tobacco: Never Used  . Alcohol Use: No    Allergies: No Known Allergies  Prescriptions prior to admission  Medication Sig Dispense Refill Last Dose  . metroNIDAZOLE (FLAGYL)  500 MG tablet Take 1 tablet (500 mg total) by mouth 2 (two) times daily. 14 tablet 0   . Prenatal Vit-Fe Fumarate-FA (PRENATAL MULTIVITAMIN) TABS Take 1 tablet by mouth daily. 30 tablet 11 11/22/2014 at Unknown time    ROS  See HPI Above Physical Exam   Blood pressure 127/82, pulse 77, temperature 97.9 F (36.6 C), temperature source Oral, resp. rate 16, height  (1.702 m), SpO2 100 %, currently breastfeeding.  Results for orders placed or performed during the hospital encounter of 05/07/15 (from the past 24 hour(s))  Urinalysis, Routine w reflex microscopic (not at [facility])     Status: Abnormal   Collection Time: 05/07/15  7:07 PM  Result Value Ref Range   Color, Urine YELLOW YELLOW   APPearance CLEAR CLEAR   Specific Gravity, Urine >1.030 (H) 1.005 - 1.030   pH 5.5 5.0 - 8.0   Glucose, UA NEGATIVE NEGATIVE mg/dL   Hgb urine dipstick NEGATIVE NEGATIVE   Bilirubin Urine NEGATIVE NEGATIVE   Ketones, ur 15 (A) NEGATIVE mg/dL   Protein, ur NEGATIVE NEGATIVE mg/dL   Nitrite NEGATIVE NEGATIVE   Leukocytes, UA NEGATIVE NEGATIVE    Physical Exam  Constitutional: She is oriented to person, place, and time. She appears well-developed and well-nourished.  HENT:  Head: Normocephalic and atraumatic.  Eyes: Conjunctivae are normal.  Neck: Normal range of motion.  Cardiovascular: Normal rate, regular rhythm and normal heart sounds.   Respiratory: Effort normal.  GI: Soft.  Musculoskeletal: Normal range of motion.  Neurological: She is alert and oriented to person, place, and time.  Skin: Skin is warm and dry.     FHR:145 bpm, Mod Var, -Decels, +Accels UC: Q3-525min, palpates mild ED Course  Assessment: IUP at 34.6wks Cat I FT Dehydration Contractions  Plan: -Given option of IV vs Oral hydration -Patient prefers Oral Hydration -Tylenol XR now -Discussed proper hydration with low sugar drinks -Will reassess as appropriate  Follow Up (2100) -Patient reports consumption of oral  fluids x 1 pitcher -Will push more fluids -Reassess in 30 to 60min  Follow Up (2125) -Reports improvement in pain and contractions -Questions what she can take for sleep-currently has tylenol pm -Informed of safety of tylenol pm  -Keep appt as scheduled: 3/14 -Encouraged to call if any questions or concerns arise prior to next scheduled office visit.  -Discharged to home in improved condition  Cherre RobinsJessica L Tiauna Whisnant CNM, MSN 05/07/2015 7:40 PM

## 2015-05-07 NOTE — Discharge Instructions (Signed)

## 2015-05-16 LAB — OB RESULTS CONSOLE GBS: STREP GROUP B AG: NEGATIVE

## 2015-06-06 ENCOUNTER — Encounter (HOSPITAL_COMMUNITY): Payer: Self-pay | Admitting: *Deleted

## 2015-06-06 ENCOUNTER — Inpatient Hospital Stay (HOSPITAL_COMMUNITY)
Admission: AD | Admit: 2015-06-06 | Discharge: 2015-06-08 | DRG: 775 | Disposition: A | Discharge status: Home / Self Care | Payer: Medicaid Other | Source: Ambulatory Visit | Attending: Obstetrics and Gynecology | Admitting: Obstetrics and Gynecology

## 2015-06-06 DIAGNOSIS — Z3A39 39 weeks gestation of pregnancy: Secondary | ICD-10-CM

## 2015-06-06 DIAGNOSIS — Z87891 Personal history of nicotine dependence: Secondary | ICD-10-CM

## 2015-06-06 DIAGNOSIS — D696 Thrombocytopenia, unspecified: Secondary | ICD-10-CM | POA: Diagnosis present

## 2015-06-06 DIAGNOSIS — O9912 Other diseases of the blood and blood-forming organs and certain disorders involving the immune mechanism complicating childbirth: Secondary | ICD-10-CM | POA: Diagnosis present

## 2015-06-06 DIAGNOSIS — Z833 Family history of diabetes mellitus: Secondary | ICD-10-CM | POA: Diagnosis not present

## 2015-06-06 LAB — COMPREHENSIVE METABOLIC PANEL
ALK PHOS: 151 U/L — AB (ref 38–126)
ALT: 11 U/L — AB (ref 14–54)
ANION GAP: 6 (ref 5–15)
AST: 22 U/L (ref 15–41)
Albumin: 3.1 g/dL — ABNORMAL LOW (ref 3.5–5.0)
BILIRUBIN TOTAL: 0.3 mg/dL (ref 0.3–1.2)
BUN: 10 mg/dL (ref 6–20)
CALCIUM: 8.6 mg/dL — AB (ref 8.9–10.3)
CO2: 21 mmol/L — AB (ref 22–32)
CREATININE: 0.67 mg/dL (ref 0.44–1.00)
Chloride: 108 mmol/L (ref 101–111)
GFR calc non Af Amer: >60 mL/min (ref >60)
GLUCOSE: 102 mg/dL — AB (ref 65–99)
Potassium: 3.9 mmol/L (ref 3.5–5.1)
SODIUM: 135 mmol/L (ref 135–145)
TOTAL PROTEIN: 6.8 g/dL (ref 6.5–8.1)

## 2015-06-06 LAB — PROTEIN / CREATININE RATIO, URINE: Creatinine, Urine: 40 mg/dL

## 2015-06-06 LAB — CBC
HCT: 34.6 % — ABNORMAL LOW (ref 36.0–46.0)
Hemoglobin: 12.2 g/dL (ref 12.0–15.0)
MCH: 32 pg (ref 26.0–34.0)
MCHC: 35.3 g/dL (ref 30.0–36.0)
MCV: 90.8 fL (ref 78.0–100.0)
PLATELETS: 180 10*3/uL (ref 150–400)
RBC: 3.81 MIL/uL — ABNORMAL LOW (ref 3.87–5.11)
RDW: 12.8 % (ref 11.5–15.5)
WBC: 11.1 10*3/uL — ABNORMAL HIGH (ref 4.0–10.5)

## 2015-06-06 LAB — TYPE AND SCREEN
ABO/RH(D): O POS
ANTIBODY SCREEN: NEGATIVE

## 2015-06-06 LAB — LACTATE DEHYDROGENASE: LDH: 139 U/L (ref 98–192)

## 2015-06-06 LAB — RPR: RPR Ser Ql: NONREACTIVE

## 2015-06-06 LAB — URIC ACID: Uric Acid, Serum: 4.7 mg/dL (ref 2.3–6.6)

## 2015-06-06 MED ORDER — LABETALOL HCL 5 MG/ML IV SOLN: 10.0000 mg | INTRAVENOUS | Status: DC | PRN

## 2015-06-06 MED ORDER — PRENATAL MULTIVITAMIN CH
1.0000 | ORAL_TABLET | Freq: Every day | ORAL | Status: DC
  Administered 2015-06-06 – 2015-06-07 (×2): 1 via ORAL
  Filled 2015-06-06 (×2): qty 1

## 2015-06-06 MED ORDER — FENTANYL CITRATE (PF) 100 MCG/2ML IJ SOLN: 50.0000 ug | INTRAMUSCULAR | Status: DC | PRN

## 2015-06-06 MED ORDER — TETANUS-DIPHTH-ACELL PERTUSSIS 5-2.5-18.5 LF-MCG/0.5 IM SUSP: 0.5000 mL | Freq: Once | INTRAMUSCULAR | Status: DC

## 2015-06-06 MED ORDER — ONDANSETRON HCL 4 MG/2ML IJ SOLN: 4.0000 mg | Freq: Four times a day (QID) | INTRAMUSCULAR | Status: DC | PRN

## 2015-06-06 MED ORDER — ACETAMINOPHEN 325 MG PO TABS: 650.0000 mg | ORAL_TABLET | ORAL | Status: DC | PRN

## 2015-06-06 MED ORDER — OXYTOCIN 10 UNIT/ML IJ SOLN
INTRAMUSCULAR | Status: AC
  Administered 2015-06-06: 10 [IU]
  Filled 2015-06-06: qty 1

## 2015-06-06 MED ORDER — OXYTOCIN BOLUS FROM INFUSION: 500.0000 mL | INTRAVENOUS | Status: DC

## 2015-06-06 MED ORDER — ONDANSETRON HCL 4 MG/2ML IJ SOLN
4.0000 mg | INTRAMUSCULAR | Status: DC | PRN
Start: 2015-06-06 — End: 2015-06-08

## 2015-06-06 MED ORDER — LIDOCAINE HCL (PF) 1 % IJ SOLN
30.0000 mL | INTRAMUSCULAR | Status: DC | PRN
  Filled 2015-06-06: qty 30

## 2015-06-06 MED ORDER — CITRIC ACID-SODIUM CITRATE 334-500 MG/5ML PO SOLN: 30.0000 mL | ORAL | Status: DC | PRN

## 2015-06-06 MED ORDER — LACTATED RINGERS IV SOLN: INTRAVENOUS | Status: DC

## 2015-06-06 MED ORDER — IBUPROFEN 600 MG PO TABS
600.0000 mg | ORAL_TABLET | Freq: Four times a day (QID) | ORAL | Status: DC
  Administered 2015-06-06 – 2015-06-08 (×8): 600 mg via ORAL
  Filled 2015-06-06 (×9): qty 1

## 2015-06-06 MED ORDER — ZOLPIDEM TARTRATE 5 MG PO TABS: 5.0000 mg | ORAL_TABLET | Freq: Every evening | ORAL | Status: DC | PRN

## 2015-06-06 MED ORDER — WITCH HAZEL-GLYCERIN EX PADS: 1.0000 "application " | MEDICATED_PAD | CUTANEOUS | Status: DC | PRN

## 2015-06-06 MED ORDER — BENZOCAINE-MENTHOL 20-0.5 % EX AERO: 1.0000 "application " | INHALATION_SPRAY | CUTANEOUS | Status: DC | PRN

## 2015-06-06 MED ORDER — OXYTOCIN 10 UNIT/ML IJ SOLN: 2.5000 [IU]/h | INTRAVENOUS | Status: DC

## 2015-06-06 MED ORDER — LACTATED RINGERS IV SOLN: 500.0000 mL | INTRAVENOUS | Status: DC | PRN

## 2015-06-06 MED ORDER — LANOLIN HYDROUS EX OINT: TOPICAL_OINTMENT | CUTANEOUS | Status: DC | PRN

## 2015-06-06 MED ORDER — DIPHENHYDRAMINE HCL 25 MG PO CAPS: 25.0000 mg | ORAL_CAPSULE | Freq: Four times a day (QID) | ORAL | Status: DC | PRN

## 2015-06-06 MED ORDER — DIBUCAINE 1 % RE OINT: 1.0000 "application " | TOPICAL_OINTMENT | RECTAL | Status: DC | PRN

## 2015-06-06 MED ORDER — SIMETHICONE 80 MG PO CHEW
80.0000 mg | CHEWABLE_TABLET | ORAL | Status: DC | PRN
Start: 2015-06-06 — End: 2015-06-08

## 2015-06-06 MED ORDER — SENNOSIDES-DOCUSATE SODIUM 8.6-50 MG PO TABS
2.0000 | ORAL_TABLET | ORAL | Status: DC
  Administered 2015-06-06 – 2015-06-08 (×2): 2 via ORAL
  Filled 2015-06-06 (×2): qty 2

## 2015-06-06 MED ORDER — ONDANSETRON HCL 4 MG PO TABS: 4.0000 mg | ORAL_TABLET | ORAL | Status: DC | PRN

## 2015-06-06 NOTE — MAU Note (Signed)
PT SAYS UC  HURT  BAD  AT 0200.  VE IN OFFICE  LAST WEEK -- 2 CM.       ALSO SAYS  SHE  HAS  COUGH-   SAW DR  Stefano GaulSTRINGER  TODAY - TOLD  HER - ALLERGIES.      DENIES HSV AND MRSA.  GBS- UNSURE.

## 2015-06-06 NOTE — H&P (Signed)
Tiffany Tapia is a 30 y.o. female, G3P2002 at 39.1 weeks, presenting for pain.  Patient states she started experiencing increased pain about one hour prior to arrival as contractions started 3-4 hours ago.  Patient reports active fetus and denies LOF and VB.  Patient does endorse having current nasal congestion, but is afebrile and without other symptoms.  Patient pregnancy history unremarkable and patient is GBS negative.    Patient Active Problem List   Diagnosis Date Noted  . Vaginal delivery 08/25/2011  . Normal pregnancy, incidental 07/15/2011  . Normal pregnancy, incidental 07/04/2011    History of present pregnancy: Patient entered care at 13.2 weeks.   EDC of 06/12/2015 was established by Definite LMP of 09/05/2014.   Anatomy scan:  19.3 weeks, with limited but normal findings.   Additional US evaluations:  F/U Anatomy at 21.3wks normal Significant prenatal events: 1st Trimester: Patient arrive for NOB interview at 13.2wks. 2nd Trimester: Patient declines influenza vaccine.  Patient c/o dizzy spells that are self resolved-advised increased hydration.   3rd Trimester:  Patient c/o increased pressure, BH contractions, pelvic pain, and problems sleeping.  Patient also c/o nasal congestion.      Last evaluation:  06/05/2015 in office by Dr. AVS.  FHR 150, BP 102/70, Wt 153lbs, TWG 23lbs  OB History    Gravida Para Term Preterm AB TAB SAB Ectopic Multiple Living   3 2 2  0 0 0 0 0 0 2    08/2009-SVD of Female 7lb 4oz at 38wks 08/2011-SVD of Female 7lb 4oz at 2257wks   Past Medical History  Diagnosis Date  . Urinary tract infection   . Normal pregnancy, incidental 07/04/2011    Transfer of care 16 weeks   . H/O candidiasis   . GBS carrier   . H/O varicella   . H/O pyelonephritis during pregnancy     At 20weeks  . Thrombocytopenia (HCC)   . Spotting during pregnancy     Second trimester    Past Surgical History  Procedure Laterality Date  . No past surgeries     Family History:  family history includes Anemia in her mother; Diabetes in her maternal grandmother. There is no history of Anesthesia problems. Social History:  reports that she quit smoking about 4 years ago. Her smoking use included Cigarettes. She has never used smokeless tobacco. She reports that she does not drink alcohol or use illicit drugs.  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   Prenatal Transfer Tool  Maternal Diabetes: No Genetic Screening: Normal Maternal Ultrasounds/Referrals: Normal Fetal Ultrasounds or other Referrals:  None Maternal Substance Abuse:  No Significant Maternal Medications:  None Significant Maternal Lab Results: Lab values include: Group B Strep negative    ROS:  +FM, -LoF, -VB  No Known Allergies   Dilation: 4.5 Effacement (%): 90 Station: -1 Exam by:: Orinda KennerKimberly Whitehurst Boyd RN Blood pressure 139/86, pulse 67, temperature 98.4 F (36.9 C), temperature source Oral, resp. rate 18, height 5\' 6"  (1.676 m), weight 69.57 kg (153 lb 6 oz), currently breastfeeding.  Physical Exam  Constitutional: She is oriented to person, place, and time. She appears well-developed and well-nourished. She appears distressed.  HENT:  Head: Normocephalic and atraumatic.  Eyes: Conjunctivae are normal.  Neck: Normal range of motion.  Respiratory: Effort normal. No respiratory distress. She has wheezes in the right middle field and the right lower field.  GI: Soft. Bowel sounds are normal.  Gravid--fundal height appears AGA, Soft, NT  Musculoskeletal: Normal range of motion. She exhibits no edema.  Neurological: She is alert and oriented to person, place, and time.  Skin: Skin is warm and dry.    Leopolds: EFW: Deferred Presentation: Vertex by nurse exam  FHR:145 bpm, Mod Var, -Decels, +Accels UCs:  Q2-6min  Prenatal labs: ABO, Rh:  O Positive Antibody:  Negative Rubella:  Not Immune RPR:   NR HBsAg:   Negative HIV: Non Reactive (09/21 0940)  GBS:   Negative Sickle cell/Hgb electrophoresis:  Normal Pap:  Unknown GC:  Negative Chlamydia:  Negative Other:  None    Assessment IUP at 39.1wks Cat I FT Active Labor GBS Negative  Plan: Admit to [employer]  Routine Labor and Delivery Orders per [facility] Protocol In room to complete assessment and discuss POC: Nurse reports patient now 9cm Room prepped for delivery Hold on IV placement-will give IM pitocin Anticipate SVD Dr.T.C to be updated as appropriate  Tiffany Quails, MSN 06/06/2015, 3:23 AM

## 2015-06-06 NOTE — Lactation Note (Signed)
This note was copied from a baby'Tapia chart. Lactation Consultation Note  Initial visit made.  Breastfeeding consultation services and support information given and reviewed.  Mom states she just stopped breastfeeding her 30 year old.  She states newborn of 6 hours has fed well.  Reminded mom of normal newborn behaviors.  Encouraged to call with questions/concerns prn.  Patient Name: Tiffany Rondel BatonDallas Furtick RUEAV'WToday'Tapia Date: Tapia Reason for consult: Initial assessment   Maternal Data    Feeding Feeding Type: Breast Fed  LATCH Score/Interventions                      Lactation Tools Discussed/Used     Consult Status Consult Status: Follow-up Date: 06/07/15 Follow-up type: In-patient    Huston FoleyMOULDEN, Tiffany Tapia, 10:39 AM

## 2015-06-06 NOTE — Progress Notes (Signed)
CM / UR chart review completed.  

## 2015-06-07 LAB — CBC
HCT: 31.5 % — ABNORMAL LOW (ref 36.0–46.0)
Hemoglobin: 10.9 g/dL — ABNORMAL LOW (ref 12.0–15.0)
MCH: 32 pg (ref 26.0–34.0)
MCHC: 34.6 g/dL (ref 30.0–36.0)
MCV: 92.4 fL (ref 78.0–100.0)
PLATELETS: 166 10*3/uL (ref 150–400)
RBC: 3.41 MIL/uL — AB (ref 3.87–5.11)
RDW: 13.1 % (ref 11.5–15.5)
WBC: 13.3 10*3/uL — ABNORMAL HIGH (ref 4.0–10.5)

## 2015-06-07 NOTE — Progress Notes (Signed)
Subjective: Postpartum Day 1: Vaginal delivery, No laceration Patient up ad lib, reports no syncope or dizziness. Feeding:  Breast Contraceptive plan:  Undecided,  Progesterone based BC options  for Breastfeeding mom's discussed  Cramping well controlled with Ibuprofen   Objective: Vital signs in last 24 hours: Temp:  [98.2 F (36.8 C)-98.4 F (36.9 C)] 98.4 F (36.9 C) (04/05 0503) Pulse Rate:  [66-67] 67 (04/05 0503) Resp:  [18-20] 20 (04/05 0503) BP: (105-120)/(70-75) 120/75 mmHg (04/05 0503)  Physical Exam:  General: alert and cooperative Lochia: appropriate Uterine Fundus: firm Perineum: healing well DVT Evaluation: No evidence of DVT seen on physical exam.   CBC Latest Ref Rng 06/07/2015 06/06/2015 11/23/2014  WBC 4.0 - 10.5 K/uL 13.3(H) 11.1(H) 7.0  Hemoglobin 12.0 - 15.0 g/dL 10.9(L) 12.2 11.1(L)  Hematocrit 36.0 - 46.0 % 31.5(L) 34.6(L) 32.2(L)  Platelets 150 - 400 K/uL 166 180 148(L)   Results for orders placed or performed during the hospital encounter of 06/06/15 (from the past 24 hour(s))  CBC     Status: Abnormal   Collection Time: 06/07/15  5:58 AM  Result Value Ref Range   WBC 13.3 (H) 4.0 - 10.5 K/uL   RBC 3.41 (L) 3.87 - 5.11 MIL/uL   Hemoglobin 10.9 (L) 12.0 - 15.0 g/dL   HCT 45.431.5 (L) 09.836.0 - 11.946.0 %   MCV 92.4 78.0 - 100.0 fL   MCH 32.0 26.0 - 34.0 pg   MCHC 34.6 30.0 - 36.0 g/dL   RDW 14.713.1 82.911.5 - 56.215.5 %   Platelets 166 150 - 400 K/uL   . ibuprofen  600 mg Oral 4 times per day  . prenatal multivitamin  1 tablet Oral Q1200  . senna-docusate  2 tablet Oral Q24H      Assessment/Plan: Status post vaginal delivery day 2. Stable Continue current care. Plan for discharge tomorrow    Beatrix FettersRachel StallCNM 06/07/2015, 12:51 PM

## 2015-06-07 NOTE — Lactation Note (Signed)
This note was copied from a baby's chart. Lactation Consultation Note  Patient Name: Tiffany Tapia ZOXWR'UToday's Date: 06/07/2015 Reason for consult: Follow-up assessment Bbay 33 hours old, and has been consistently breast feeding and exclusive. Per mom the my breast are feeling fuller and heavier and probably will need to pump.  Baby awake and showing feeding cues. LC offered mom pillow support and mom declined.  @ 1st mom latching with cradle position and per mom baby doesn't open her mouth well .  LC showed mom how to challenge baby to open wide by tickling upper lip until mouth wide open  And latch with breast compressions. During the feeding noted mom to be leaning over and suggested  Pillow support to enhance her posture and mom as prevention of back soreness.  Baby did latch with depth, multiply swallows noted, and increased with breast compressions, baby fed 21 mins  And released. Nipple well rounded. Mom concerned she may have over fullness . LC encouraged releasing 2nd  breast down by hand expressing or pumping with hand pump. LC set up hand pump and cleaning instructions. Per  Mom used #24 Flange with her last baby.  Sore nipple and engorgement prevention and tx reviewed. And storage of breast milk.  Encouraged mom to call if needed on nurses light.   Maternal Data Has patient been taught Hand Expression?: Yes Does the patient have breastfeeding experience prior to this delivery?: Yes  Feeding Feeding Type: Breast Fed Length of feed: 21 min (mutliply swallows )  LATCH Score/Interventions Latch: Grasps breast easily, tongue down, lips flanged, rhythmical sucking. Intervention(s): Adjust position;Assist with latch;Breast massage;Breast compression  Audible Swallowing: Spontaneous and intermittent Intervention(s): Skin to skin;Hand expression  Type of Nipple: Everted at rest and after stimulation  Comfort (Breast/Nipple): Filling, red/small blisters or bruises, mild/mod  discomfort  Problem noted: Filling Interventions (Mild/moderate discomfort): Hand massage;Hand expression  Hold (Positioning): Assistance needed to correctly position infant at breast and maintain latch. Intervention(s): Breastfeeding basics reviewed;Support Pillows;Position options;Skin to skin  LATCH Score: 8  Lactation Tools Discussed/Used Tools: Pump (breast are getting full ) Breast pump type: Manual WIC Program: No Pump Review: Setup, frequency, and cleaning;Milk Storage Initiated by:: MAII  Date initiated:: 06/07/15   Consult Status Consult Status: PRN Date: 06/07/15    Kathrin Greathouseorio, Camile Esters Ann 06/07/2015, 1:41 PM

## 2015-06-08 MED ORDER — MEDROXYPROGESTERONE ACETATE 150 MG/ML IM SUSP
150.0000 mg | Freq: Once | INTRAMUSCULAR | Status: AC
  Administered 2015-06-08: 150 mg via INTRAMUSCULAR
  Filled 2015-06-08: qty 1

## 2015-06-08 MED ORDER — IBUPROFEN 600 MG PO TABS
600.0000 mg | ORAL_TABLET | Freq: Four times a day (QID) | ORAL | Status: DC
Start: 2015-06-08 — End: 2017-12-25

## 2015-06-08 NOTE — Progress Notes (Signed)
Patient rubella non immune. Patient has received VIS and has been explained information on vaccine. Patient declines MMR vaccine. Maxwell Caul, Leretha Dykes [place]

## 2015-06-08 NOTE — Discharge Instructions (Signed)
Postpartum Care After Vaginal Delivery °After you deliver your newborn (postpartum period), the usual stay in the hospital is 24-72 hours. If there were problems with your labor or delivery, or if you have other medical problems, you might be in the hospital longer.  °While you are in the hospital, you will receive help and instructions on how to care for yourself and your newborn during the postpartum period.  °While you are in the hospital: °· Be sure to tell your nurses if you have pain or discomfort, as well as where you feel the pain and what makes the pain worse. °· If you had an incision made near your vagina (episiotomy) or if you had some tearing during delivery, the nurses may put ice packs on your episiotomy or tear. The ice packs may help to reduce the pain and swelling. °· If you are breastfeeding, you may feel uncomfortable contractions of your uterus for a couple of weeks. This is normal. The contractions help your uterus get back to normal size. °· It is normal to have some bleeding after delivery. °· For the first 1-3 days after delivery, the flow is red and the amount may be similar to a period. °· It is common for the flow to start and stop. °· In the first few days, you may pass some small clots. Let your nurses know if you begin to pass large clots or your flow increases. °· Do not  flush blood clots down the toilet before having the nurse look at them. °· During the next 3-10 days after delivery, your flow should become more watery and pink or brown-tinged in color. °· Ten to fourteen days after delivery, your flow should be a small amount of yellowish-white discharge. °· The amount of your flow will decrease over the first few weeks after delivery. Your flow may stop in 6-8 weeks. Most women have had their flow stop by 12 weeks after delivery. °· You should change your sanitary pads frequently. °· Wash your hands thoroughly with soap and water for at least 20 seconds after changing pads, using  the toilet, or before holding or feeding your newborn. °· You should feel like you need to empty your bladder within the first 6-8 hours after delivery. °· In case you become weak, lightheaded, or faint, call your nurse before you get out of bed for the first time and before you take a shower for the first time. °· Within the first few days after delivery, your breasts may begin to feel tender and full. This is called engorgement. Breast tenderness usually goes away within 48-72 hours after engorgement occurs. You may also notice milk leaking from your breasts. If you are not breastfeeding, do not stimulate your breasts. Breast stimulation can make your breasts produce more milk. °· Spending as much time as possible with your newborn is very important. During this time, you and your newborn can feel close and get to know each other. Having your newborn stay in your room (rooming in) will help to strengthen the bond with your newborn.  It will give you time to get to know your newborn and become comfortable caring for your newborn. °· Your hormones change after delivery. Sometimes the hormone changes can temporarily cause you to feel sad or tearful. These feelings should not last more than a few days. If these feelings last longer than that, you should talk to your caregiver. °· If desired, talk to your caregiver about methods of family planning or contraception. °·   Talk to your caregiver about immunizations. Your caregiver may want you to have the following immunizations before leaving the hospital: °· Tetanus, diphtheria, and pertussis (Tdap) or tetanus and diphtheria (Td) immunization. It is very important that you and your family (including grandparents) or others caring for your newborn are up-to-date with the Tdap or Td immunizations. The Tdap or Td immunization can help protect your newborn from getting ill. °· Rubella immunization. °· Varicella (chickenpox) immunization. °· Influenza immunization. You should  receive this annual immunization if you did not receive the immunization during your pregnancy. °  °This information is not intended to replace advice given to you by your health care provider. Make sure you discuss any questions you have with your health care provider. °  °Document Released: 12/16/2006 Document Revised: 11/13/2011 Document Reviewed: 10/16/2011 °Elsevier Interactive Patient Education ©2016 [employer] °Breastfeeding °Deciding to breastfeed is one of the best choices you can make for you and your baby. A change in hormones during pregnancy causes your breast tissue to grow and increases the number and size of your milk ducts. These hormones also allow proteins, sugars, and fats from your blood supply to make breast milk in your milk-producing glands. Hormones prevent breast milk from being released before your baby is born as well as prompt milk flow after birth. Once breastfeeding has begun, thoughts of your baby, as well as his or her sucking or crying, can stimulate the release of milk from your milk-producing glands.  °BENEFITS OF BREASTFEEDING °For Your Baby °· Your first milk (colostrum) helps your baby's digestive system function better. °· There are antibodies in your milk that help your baby fight off infections. °· Your baby has a lower incidence of asthma, allergies, and sudden infant death syndrome. °· The nutrients in breast milk are better for your baby than infant formulas and are designed uniquely for your baby's needs. °· Breast milk improves your baby's brain development. °· Your baby is less likely to develop other conditions, such as childhood obesity, asthma, or type 2 diabetes mellitus. °For You °· Breastfeeding helps to create a very special bond between you and your baby. °· Breastfeeding is convenient. Breast milk is always available at the correct temperature and costs nothing. °· Breastfeeding helps to burn calories and helps you lose the weight gained during  pregnancy. °· Breastfeeding makes your uterus contract to its prepregnancy size faster and slows bleeding (lochia) after you give birth.   °· Breastfeeding helps to lower your risk of developing type 2 diabetes mellitus, osteoporosis, and breast or ovarian cancer later in life. °SIGNS THAT YOUR BABY IS HUNGRY °Early Signs of Hunger °· Increased alertness or activity. °· Stretching. °· Movement of the head from side to side. °· Movement of the head and opening of the mouth when the corner of the mouth or cheek is stroked (rooting). °· Increased sucking sounds, smacking lips, cooing, sighing, or squeaking. °· Hand-to-mouth movements. °· Increased sucking of fingers or hands. °Late Signs of Hunger °· Fussing. °· Intermittent crying. °Extreme Signs of Hunger °Signs of extreme hunger will require calming and consoling before your baby will be able to breastfeed successfully. Do not wait for the following signs of extreme hunger to occur before you initiate breastfeeding: °· Restlessness. °· A loud, strong cry. °· Screaming. °BREASTFEEDING BASICS °Breastfeeding Initiation °· Find a comfortable place to sit or lie down, with your neck and back well supported. °· Place a pillow or rolled up blanket under your baby to bring him or   her to the level of your breast (if you are seated). Nursing pillows are specially designed to help support your arms and your baby while you breastfeed. °· Make sure that your baby's abdomen is facing your abdomen. °· Gently massage your breast. With your fingertips, massage from your chest wall toward your nipple in a circular motion. This encourages milk flow. You may need to continue this action during the feeding if your milk flows slowly. °· Support your breast with 4 fingers underneath and your thumb above your nipple. Make sure your fingers are well away from your nipple and your baby's mouth. °· Stroke your baby's lips gently with your finger or nipple. °· When your baby's mouth is open  wide enough, quickly bring your baby to your breast, placing your entire nipple and as much of the colored area around your nipple (areola) as possible into your baby's mouth. °· More areola should be visible above your baby's upper lip than below the lower lip. °· Your baby's tongue should be between his or her lower gum and your breast. °· Ensure that your baby's mouth is correctly positioned around your nipple (latched). Your baby's lips should create a seal on your breast and be turned out (everted). °· It is common for your baby to suck about 2-3 minutes in order to start the flow of breast milk. °Latching °Teaching your baby how to latch on to your breast properly is very important. An improper latch can cause nipple pain and decreased milk supply for you and poor weight gain in your baby. Also, if your baby is not latched onto your nipple properly, he or she may swallow some air during feeding. This can make your baby fussy. Burping your baby when you switch breasts during the feeding can help to get rid of the air. However, teaching your baby to latch on properly is still the best way to prevent fussiness from swallowing air while breastfeeding. °Signs that your baby has successfully latched on to your nipple: °· Silent tugging or silent sucking, without causing you pain. °· Swallowing heard between every 3-4 sucks. °· Muscle movement above and in front of his or her ears while sucking. °Signs that your baby has not successfully latched on to nipple: °· Sucking sounds or smacking sounds from your baby while breastfeeding. °· Nipple pain. °If you think your baby has not latched on correctly, slip your finger into the corner of your baby's mouth to break the suction and place it between your baby's gums. Attempt breastfeeding initiation again. °Signs of Successful Breastfeeding °Signs from your baby: °· A gradual decrease in the number of sucks or complete cessation of sucking. °· Falling asleep. °· Relaxation  of his or her body. °· Retention of a small amount of milk in his or her mouth. °· Letting go of your breast by himself or herself. °Signs from you: °· Breasts that have increased in firmness, weight, and size 1-3 hours after feeding. °· Breasts that are softer immediately after breastfeeding. °· Increased milk volume, as well as a change in milk consistency and color by the fifth day of breastfeeding. °· Nipples that are not sore, cracked, or bleeding. °Signs That Your Baby is Getting Enough Milk °· Wetting at least 3 diapers in a 24-hour period. The urine should be clear and pale yellow by age 5 days. °· At least 3 stools in a 24-hour period by age 5 days. The stool should be soft and yellow. °· At least 3   stools in a 24-hour period by age 7 days. The stool should be seedy and yellow. °· No loss of weight greater than 10% of birth weight during the first 3 days of age. °· Average weight gain of 4-7 ounces (113-198 g) per week after age 4 days. °· Consistent daily weight gain by age 5 days, without weight loss after the age of 2 weeks. °After a feeding, your baby may spit up a small amount. This is common. °BREASTFEEDING FREQUENCY AND DURATION °Frequent feeding will help you make more milk and can prevent sore nipples and breast engorgement. Breastfeed when you feel the need to reduce the fullness of your breasts or when your baby shows signs of hunger. This is called "breastfeeding on demand." Avoid introducing a pacifier to your baby while you are working to establish breastfeeding (the first 4-6 weeks after your baby is born). After this time you may choose to use a pacifier. Research has shown that pacifier use during the first year of a baby's life decreases the risk of sudden infant death syndrome (SIDS). °Allow your baby to feed on each breast as long as he or she wants. Breastfeed until your baby is finished feeding. When your baby unlatches or falls asleep while feeding from the first breast, offer the  second breast. Because newborns are often sleepy in the first few weeks of life, you may need to awaken your baby to get him or her to feed. °Breastfeeding times will vary from baby to baby. However, the following rules can serve as a guide to help you ensure that your baby is properly fed: °· Newborns (babies 4 weeks of age or younger) may breastfeed every 1-3 hours. °· Newborns should not go longer than 3 hours during the day or 5 hours during the night without breastfeeding. °· You should breastfeed your baby a minimum of 8 times in a 24-hour period until you begin to introduce solid foods to your baby at around 6 months of age. °BREAST MILK PUMPING °Pumping and storing breast milk allows you to ensure that your baby is exclusively fed your breast milk, even at times when you are unable to breastfeed. This is especially important if you are going back to work while you are still breastfeeding or when you are not able to be present during feedings. Your lactation consultant can give you guidelines on how long it is safe to store breast milk. °A breast pump is a machine that allows you to pump milk from your breast into a sterile bottle. The pumped breast milk can then be stored in a refrigerator or freezer. Some breast pumps are operated by hand, while others use electricity. Ask your lactation consultant which type will work best for you. Breast pumps can be purchased, but some hospitals and breastfeeding support groups lease breast pumps on a monthly basis. A lactation consultant can teach you how to hand express breast milk, if you prefer not to use a pump. °CARING FOR YOUR BREASTS WHILE YOU BREASTFEED °Nipples can become dry, cracked, and sore while breastfeeding. The following recommendations can help keep your breasts moisturized and healthy: °· Avoid using soap on your nipples. °· Wear a supportive bra. Although not required, special nursing bras and tank tops are designed to allow access to your breasts  for breastfeeding without taking off your entire bra or top. Avoid wearing underwire-style bras or extremely tight bras. °· Air dry your nipples for 3-4 minutes after each feeding. °· Use only cotton bra pads   pads to absorb leaked breast milk. Leaking of breast milk between feedings is normal.  Use lanolin on your nipples after breastfeeding. Lanolin helps to maintain your skin's normal moisture barrier. If you use pure lanolin, you do not need to wash it off before feeding your baby again. Pure lanolin is not toxic to your baby. You may also hand express a few drops of breast milk and gently massage that milk into your nipples and allow the milk to air dry. In the first few weeks after giving birth, some women experience extremely full breasts (engorgement). Engorgement can make your breasts feel heavy, warm, and tender to the touch. Engorgement peaks within 3-5 days after you give birth. The following recommendations can help ease engorgement:  Completely empty your breasts while breastfeeding or pumping. You may want to start by applying warm, moist heat (in the shower or with warm water-soaked hand towels) just before feeding or pumping. This increases circulation and helps the milk flow. If your baby does not completely empty your breasts while breastfeeding, pump any extra milk after he or she is finished.  Wear a snug bra (nursing or regular) or tank top for 1-2 days to signal your body to slightly decrease milk production.  Apply ice packs to your breasts, unless this is too uncomfortable for you.  Make sure that your baby is latched on and positioned properly while breastfeeding. If engorgement persists after 48 hours of following these recommendations, contact your health care provider or a Science writer. OVERALL HEALTH CARE RECOMMENDATIONS WHILE BREASTFEEDING  Eat healthy foods. Alternate between meals and snacks, eating 3 of each per day. Because what you eat affects your breast milk,  some of the foods may make your baby more irritable than usual. Avoid eating these foods if you are sure that they are negatively affecting your baby.  Drink milk, fruit juice, and water to satisfy your thirst (about 10 glasses a day).  Rest often, relax, and continue to take your prenatal vitamins to prevent fatigue, stress, and anemia.  Continue breast self-awareness checks.  Avoid chewing and smoking tobacco. Chemicals from cigarettes that pass into breast milk and exposure to secondhand smoke may harm your baby.  Avoid alcohol and drug use, including marijuana. Some medicines that may be harmful to your baby can pass through breast milk. It is important to ask your health care provider before taking any medicine, including all over-the-counter and prescription medicine as well as vitamin and herbal supplements. It is possible to become pregnant while breastfeeding. If birth control is desired, ask your health care provider about options that will be safe for your baby. SEEK MEDICAL CARE IF:  You feel like you want to stop breastfeeding or have become frustrated with breastfeeding.  You have painful breasts or nipples.  Your nipples are cracked or bleeding.  Your breasts are red, tender, or warm.  You have a swollen area on either breast.  You have a fever or chills.  You have nausea or vomiting.  You have drainage other than breast milk from your nipples.  Your breasts do not become full before feedings by the fifth day after you give birth.  You feel sad and depressed.  Your baby is too sleepy to eat well.  Your baby is having trouble sleeping.   Your baby is wetting less than 3 diapers in a 24-hour period.  Your baby has less than 3 stools in a 24-hour period.  Your baby's skin or the white part of  or her eyes becomes yellow.   °· Your baby is not gaining weight by 5 days of age. °SEEK IMMEDIATE MEDICAL CARE IF: °· Your baby is overly tired (lethargic) and does  not want to wake up and feed. °· Your baby develops an unexplained fever. °  °This information is not intended to replace advice given to you by your health care provider. Make sure you discuss any questions you have with your health care provider. °  °Document Released: 02/18/2005 Document Revised: 11/09/2014 Document Reviewed: 08/12/2012 °Elsevier Interactive Patient Education ©2016 [employer] Inc[employer] °Iron-Rich Diet °Iron is a mineral that helps your body to produce hemoglobin. Hemoglobin is a protein in your red blood cells that carries oxygen to your body's tissues. Eating too little iron may cause you to feel weak and tired, and it can increase your risk for infection. Eating enough iron is necessary for your body's metabolism, muscle function, and nervous system. °Iron is naturally found in many foods. It can also be added to foods or fortified in foods. There are two types of dietary iron: °· Heme iron. Heme iron is absorbed by the body more easily than nonheme iron. Heme iron is found in meat, poultry, and fish. °· Nonheme iron. Nonheme iron is found in dietary supplements, iron-fortified grains, beans, and vegetables. °You may need to follow an iron-rich diet if: °· You have been diagnosed with iron deficiency or iron-deficiency anemia. °· You have a condition that prevents you from absorbing dietary iron, such as: °· Infection in your intestines. °· Celiac disease. This involves long-lasting (chronic) inflammation of your intestines. °· You do not eat enough iron. °· You eat a diet that is high in foods that impair iron absorption. °· You have lost a lot of blood. °· You have heavy bleeding during your menstrual cycle. °· You are pregnant. °WHAT IS MY PLAN? °Your health care provider may help you to determine how much iron you need per day based on your condition. Generally, when a person consumes sufficient amounts of iron in the diet, the following iron needs are met: °· Men. °· 14-18 years old: 11 mg per  day. °· 19-50 years old: 8 mg per day. °· Women.   °· 14-18 years old: 15 mg per day. °· 19-50 years old: 18 mg per day. °· Over 50 years old: 8 mg per day. °· Pregnant women: 27 mg per day. °· Breastfeeding women: 9 mg per day. °WHAT DO I NEED TO KNOW ABOUT AN IRON-RICH DIET? °· Eat fresh fruits and vegetables that are high in vitamin C along with foods that are high in iron. This will help increase the amount of iron that your body absorbs from food, especially with foods containing nonheme iron. Foods that are high in vitamin C include oranges, peppers, tomatoes, and mango. °· Take iron supplements only as directed by your health care provider. Overdose of iron can be life-threatening. If you were prescribed iron supplements, take them with orange juice or a vitamin C supplement. °· Cook foods in pots and pans that are made from iron.   °· Eat nonheme iron-containing foods alongside foods that are high in heme iron. This helps to improve your iron absorption.   °· Certain foods and drinks contain compounds that impair iron absorption. Avoid eating these foods in the same meal as iron-rich foods or with iron supplements. These include: °· Coffee, black tea, and red wine. °· Milk, dairy products, and foods that are high in calcium. °· Beans, soybeans, and peas. °·   peas.  Whole grains.  When eating foods that contain both nonheme iron and compounds that impair iron absorption, follow these tips to absorb iron better.   Soak beans overnight before cooking.  Soak whole grains overnight and drain them before using.  Ferment flours before baking, such as using yeast in bread dough. WHAT FOODS CAN I EAT? Grains Iron-fortified breakfast cereal. Iron-fortified whole-wheat bread. Enriched rice. Sprouted grains. Vegetables Spinach. Potatoes with skin. Green peas. Broccoli. Red and green bell peppers. Fermented vegetables. Fruits Prunes. Raisins. Oranges. Strawberries. Mango. Grapefruit. Meats and Other Protein  Sources Beef liver. Oysters. Beef. Shrimp. [place]. Chicken. [place]. Sardines. Chickpeas. Nuts. Tofu. Beverages Tomato juice. Fresh orange juice. Prune juice. Hibiscus tea. Fortified instant breakfast shakes. Condiments Tahini. Fermented soy sauce. Sweets and Desserts Black-strap molasses.  Other Wheat germ. The items listed above may not be a complete list of recommended foods or beverages. Contact your dietitian for more options. WHAT FOODS ARE NOT RECOMMENDED? Grains Whole grains. Bran cereal. Bran flour. Oats. Vegetables Artichokes. Brussels sprouts. Kale. Fruits Blueberries. Raspberries. Strawberries. Figs. Meats and Other Protein Sources Soybeans. Products made from soy protein. Dairy Milk. Cream. Cheese. Yogurt. Cottage cheese. Beverages Coffee. Black tea. Red wine. Sweets and Desserts Cocoa. Chocolate. Ice cream. Other Basil. Oregano. Parsley. The items listed above may not be a complete list of foods and beverages to avoid. Contact your dietitian for more information.   This information is not intended to replace advice given to you by your health care provider. Make sure you discuss any questions you have with your health care provider.   Document Released: 10/02/2004 Document Revised: 03/11/2014 Document Reviewed: 09/15/2013 Elsevier Interactive Patient Education 2016 [employer]. Postpartum Depression and Baby Blues The postpartum period begins right after the birth of a baby. During this time, there is often a great amount of joy and excitement. It is also a time of many changes in the life of the parents. Regardless of how many times a mother gives birth, each child brings new challenges and dynamics to the family. It is not unusual to have feelings of excitement along with confusing shifts in moods, emotions, and thoughts. All mothers are at risk of developing postpartum depression or the "baby blues." These mood changes can occur right after giving  birth, or they may occur many months after giving birth. The baby blues or postpartum depression can be mild or severe. Additionally, postpartum depression can go away rather quickly, or it can be a long-term condition.  CAUSES Raised hormone levels and the rapid drop in those levels are thought to be a main cause of postpartum depression and the baby blues. A number of hormones change during and after pregnancy. Estrogen and progesterone usually decrease right after the delivery of your baby. The levels of thyroid hormone and various cortisol steroids also rapidly drop. Other factors that play a role in these mood changes include major life events and genetics.  RISK FACTORS If you have any of the following risks for the baby blues or postpartum depression, know what symptoms to watch out for during the postpartum period. Risk factors that may increase the likelihood of getting the baby blues or postpartum depression include:  Having a personal or family history of depression.   Having depression while being pregnant.   Having premenstrual mood issues or mood issues related to oral contraceptives.  Having a lot of life stress.   Having marital conflict.   Lacking a social support network.   Having a baby  needs.   °· Having health problems, such as diabetes.   °SIGNS AND SYMPTOMS °Symptoms of baby blues include: °· Brief changes in mood, such as going from extreme happiness to sadness. °· Decreased concentration.   °· Difficulty sleeping.   °· Crying spells, tearfulness.   °· Irritability.   °· Anxiety.   °Symptoms of postpartum depression typically begin within the first month after giving birth. These symptoms include: °· Difficulty sleeping or excessive sleepiness.   °· Marked weight loss.   °· Agitation.   °· Feelings of worthlessness.   °· Lack of interest in activity or food.   °Postpartum psychosis is a very serious condition and can be dangerous. Fortunately, it is rare.  Displaying any of the following symptoms is cause for immediate medical attention. Symptoms of postpartum psychosis include:  °· Hallucinations and delusions.   °· Bizarre or disorganized behavior.   °· Confusion or disorientation.   °DIAGNOSIS  °A diagnosis is made by an evaluation of your symptoms. There are no medical or lab tests that lead to a diagnosis, but there are various questionnaires that a health care provider may use to identify those with the baby blues, postpartum depression, or psychosis. Often, a screening tool called the Edinburgh Postnatal Depression Scale is used to diagnose depression in the postpartum period.  °TREATMENT °The baby blues usually goes away on its own in 1-2 weeks. Social support is often all that is needed. You will be encouraged to get adequate sleep and rest. Occasionally, you may be given medicines to help you sleep.  °Postpartum depression requires treatment because it can last several months or longer if it is not treated. Treatment may include individual or group therapy, medicine, or both to address any social, physiological, and psychological factors that may play a role in the depression. Regular exercise, a healthy diet, rest, and social support may also be strongly recommended.  °Postpartum psychosis is more serious and needs treatment right away. Hospitalization is often needed. °HOME CARE INSTRUCTIONS °· Get as much rest as you can. Nap when the baby sleeps.   °· Exercise regularly. Some women find yoga and walking to be beneficial.   °· Eat a balanced and nourishing diet.   °· Do little things that you enjoy. Have a cup of tea, take a bubble bath, read your favorite magazine, or listen to your favorite music. °· Avoid alcohol.   °· Ask for help with household chores, cooking, grocery shopping, or running errands as needed. Do not try to do everything.   °· Talk to people close to you about how you are feeling. Get support from your partner, family members, friends,  or other new moms. °· Try to stay positive in how you think. Think about the things you are grateful for.   °· Do not spend a lot of time alone.   °· Only take over-the-counter or prescription medicine as directed by your health care provider. °· Keep all your postpartum appointments.   °· Let your health care provider know if you have any concerns.   °SEEK MEDICAL CARE IF: °You are having a reaction to or problems with your medicine. °SEEK IMMEDIATE MEDICAL CARE IF: °· You have suicidal feelings.   °· You think you may harm the baby or someone else. °MAKE SURE YOU: °· Understand these instructions. °· Will watch your condition. °· Will get help right away if you are not doing well or get worse. °  °This information is not intended to replace advice given to you by your health care provider. Make sure you discuss any questions you have with your health care provider. °  °  Document Released: 11/23/2003 Document Revised: 02/23/2013 Document Reviewed: 11/30/2012 Elsevier Interactive Patient Education [employer].

## 2015-06-08 NOTE — Discharge Summary (Signed)
OB Discharge Summary     Patient Name: Tiffany Tapia DOB: 11-01-1985 MRN: [id number]  Date of admission: 06/06/2015 Delivering MD: Gerrit Heck   Date of discharge: 06/08/2015  Admitting diagnosis: 39 WEEKS CTX COUGH Intrauterine pregnancy: [redacted]w[redacted]d     Secondary diagnosis:  Principal Problem:   SVD (spontaneous vaginal delivery) Active Problems:   Indication for care in labor or delivery  Additional problems: none     Discharge diagnosis: Term Pregnancy Delivered                                                                                                Post partum procedures:none  Augmentation: none  Complications: None  Hospital course:  Onset of Labor With Vaginal Delivery     30 y.o. yo G3P3003 at [redacted]w[redacted]d was admitted in Latent Labor on 06/06/2015. Patient had an uncomplicated labor course as follows:  Membrane Rupture Time/Date: 3:57 AM ,06/06/2015   Intrapartum Procedures: Episiotomy: None [1]                                         Lacerations:  None [1]  Patient had a delivery of a Viable infant. 06/06/2015  Information for the patient's newborn:  Kisha, Messman [[id number]]  Delivery Method: Vaginal, Spontaneous Delivery (Filed from Delivery Summary)    Pateint had an uncomplicated postpartum course.  She is ambulating, tolerating a regular diet, passing flatus, and urinating well. Patient is discharged home in stable condition on 06/08/2015.    Physical exam  Filed Vitals:   06/06/15 1811 06/07/15 0503 06/07/15 1835 06/08/15 0556  BP: 105/70 120/75 122/77 110/71  Pulse: 66 67 75 74  Temp: 98.2 F (36.8 C) 98.4 F (36.9 C) 98.3 F (36.8 C) 98 F (36.7 C)  TempSrc: Oral  Oral   Resp: Height:      Weight:       General: alert and cooperative Lochia: appropriate Uterine Fundus: firm Incision: Healing well with no significant drainage DVT Evaluation: No evidence of DVT seen on physical exam. Labs: Lab Results  Component Value Date   WBC 13.3* 06/07/2015   HGB 10.9* 06/07/2015   HCT 31.5* 06/07/2015   MCV 92.4 06/07/2015   PLT 166 06/07/2015   CMP Latest Ref Rng 06/06/2015  Glucose 65 - 99 mg/dL 782(N)  BUN 6 - 20 mg/dL 10  Creatinine 5.62 - 1.30 mg/dL 8.65  Sodium 784 - 696 mmol/L 135  Potassium 3.5 - 5.1 mmol/L 3.9  Chloride 101 - 111 mmol/L 108  CO2 22 - 32 mmol/L 21(L)  Calcium 8.9 - 10.3 mg/dL 2.9(B)  Total Protein 6.5 - 8.1 g/dL 6.8  Total Bilirubin 0.3 - 1.2 mg/dL 0.3  Alkaline Phos 38 - 126 U/L 151(H)  AST 15 - 41 U/L 22  ALT 14 - 54 U/L 11(L)    Discharge instruction: per After Visit Summary and "Baby and Me Booklet".  After visit meds:    Medication List  STOP taking these medications        diphenhydramine-acetaminophen 25-500 MG Tabs tablet  Commonly known as:  TYLENOL PM      TAKE these medications        ibuprofen 600 MG tablet  Commonly known as:  ADVIL,MOTRIN  Take 1 tablet (600 mg total) by mouth every 6 (six) hours.     prenatal multivitamin Tabs tablet  Take 1 tablet by mouth daily.        Diet: routine diet  Activity: Advance as tolerated. Pelvic rest for 6 weeks.   Outpatient follow up:6 weeks Follow up Appt:No future appointments. Follow up Visit:No Follow-up on file.  Postpartum contraception: Depo Provera  Newborn Data: Live born female  Birth Weight: 7 lb 6.3 oz (3354 g) APGAR: 9, 9  Baby Feeding: Breast Disposition:home with mother   06/08/2015 Alphonzo Severanceachel Arlina Sabina, CNM

## 2016-06-19 ENCOUNTER — Encounter (HOSPITAL_COMMUNITY): Payer: Self-pay | Admitting: Obstetrics and Gynecology

## 2017-03-04 NOTE — L&D Delivery Note (Signed)
Delivery Note At  a viable female was delivered via  (Presentation: OP;  ).  APGAR: 9,9 ; weight  pending  .   Placenta status: complete.  Cord: 3V with the following complications: none.    Anesthesia: epidural  Episiotomy: none Lacerations: none Suture Repair: none Est. Blood Loss (mL): 339  Mom to postpartum.  Baby to Couplet care / Skin to Skin.  Henderson Newcomer Amiir Heckard 12/23/2017, 2:42 PM

## 2017-05-08 LAB — OB RESULTS CONSOLE RPR: RPR: NONREACTIVE

## 2017-05-27 LAB — OB RESULTS CONSOLE HIV ANTIBODY (ROUTINE TESTING): HIV: NONREACTIVE

## 2017-05-27 LAB — OB RESULTS CONSOLE HEPATITIS B SURFACE ANTIGEN: Hepatitis B Surface Ag: NEGATIVE

## 2017-11-21 LAB — OB RESULTS CONSOLE GBS: GBS: NEGATIVE

## 2017-12-23 ENCOUNTER — Other Ambulatory Visit: Payer: Self-pay

## 2017-12-23 ENCOUNTER — Inpatient Hospital Stay (HOSPITAL_COMMUNITY)
Admission: AD | Admit: 2017-12-23 | Discharge: 2017-12-25 | DRG: 797 | Disposition: A | Discharge status: Home / Self Care | Payer: Medicaid Other | Attending: Obstetrics and Gynecology | Admitting: Obstetrics and Gynecology

## 2017-12-23 ENCOUNTER — Encounter (HOSPITAL_COMMUNITY): Payer: Self-pay | Admitting: *Deleted

## 2017-12-23 ENCOUNTER — Inpatient Hospital Stay (HOSPITAL_COMMUNITY): Payer: Medicaid Other | Admitting: Anesthesiology

## 2017-12-23 DIAGNOSIS — O9912 Other diseases of the blood and blood-forming organs and certain disorders involving the immune mechanism complicating childbirth: Secondary | ICD-10-CM | POA: Diagnosis present

## 2017-12-23 DIAGNOSIS — Z3490 Encounter for supervision of normal pregnancy, unspecified, unspecified trimester: Secondary | ICD-10-CM

## 2017-12-23 DIAGNOSIS — Z283 Underimmunization status: Secondary | ICD-10-CM

## 2017-12-23 DIAGNOSIS — O1495 Unspecified pre-eclampsia, complicating the puerperium: Secondary | ICD-10-CM | POA: Diagnosis not present

## 2017-12-23 DIAGNOSIS — Z2839 Other underimmunization status: Secondary | ICD-10-CM

## 2017-12-23 DIAGNOSIS — Z3A40 40 weeks gestation of pregnancy: Secondary | ICD-10-CM

## 2017-12-23 DIAGNOSIS — O134 Gestational [pregnancy-induced] hypertension without significant proteinuria, complicating childbirth: Secondary | ICD-10-CM | POA: Diagnosis present

## 2017-12-23 DIAGNOSIS — Z87891 Personal history of nicotine dependence: Secondary | ICD-10-CM

## 2017-12-23 DIAGNOSIS — Z302 Encounter for sterilization: Secondary | ICD-10-CM

## 2017-12-23 DIAGNOSIS — D6959 Other secondary thrombocytopenia: Secondary | ICD-10-CM | POA: Diagnosis present

## 2017-12-23 DIAGNOSIS — O9989 Other specified diseases and conditions complicating pregnancy, childbirth and the puerperium: Secondary | ICD-10-CM

## 2017-12-23 DIAGNOSIS — Z3483 Encounter for supervision of other normal pregnancy, third trimester: Secondary | ICD-10-CM | POA: Diagnosis present

## 2017-12-23 HISTORY — DX: Other specified health status: Z78.9

## 2017-12-23 LAB — COMPREHENSIVE METABOLIC PANEL
ALBUMIN: 3.1 g/dL — AB (ref 3.5–5.0)
ALT: 13 U/L (ref 0–44)
AST: 20 U/L (ref 15–41)
Alkaline Phosphatase: 123 U/L (ref 38–126)
Anion gap: 9 (ref 5–15)
BILIRUBIN TOTAL: 0.9 mg/dL (ref 0.3–1.2)
BUN: 6 mg/dL (ref 6–20)
CHLORIDE: 108 mmol/L (ref 98–111)
CO2: 20 mmol/L — AB (ref 22–32)
Calcium: 8.6 mg/dL — ABNORMAL LOW (ref 8.9–10.3)
Creatinine, Ser: 0.55 mg/dL (ref 0.44–1.00)
GFR calc Af Amer: >60 mL/min (ref >60)
GFR calc non Af Amer: >60 mL/min (ref >60)
GLUCOSE: 81 mg/dL (ref 70–99)
POTASSIUM: 4 mmol/L (ref 3.5–5.1)
SODIUM: 137 mmol/L (ref 135–145)
TOTAL PROTEIN: 5.9 g/dL — AB (ref 6.5–8.1)

## 2017-12-23 LAB — CBC
HEMATOCRIT: 38.8 % (ref 36.0–46.0)
Hemoglobin: 13.2 g/dL (ref 12.0–15.0)
MCH: 32.6 pg (ref 26.0–34.0)
MCHC: 34 g/dL (ref 30.0–36.0)
MCV: 95.8 fL (ref 80.0–100.0)
Platelets: 137 10*3/uL — ABNORMAL LOW (ref 150–400)
RBC: 4.05 MIL/uL (ref 3.87–5.11)
RDW: 12.9 % (ref 11.5–15.5)
WBC: 8.7 10*3/uL (ref 4.0–10.5)

## 2017-12-23 LAB — TYPE AND SCREEN
ABO/RH(D): O POS
ANTIBODY SCREEN: NEGATIVE

## 2017-12-23 LAB — PROTEIN / CREATININE RATIO, URINE
Creatinine, Urine: 87 mg/dL
Protein Creatinine Ratio: 0.08 mg/mg{Cre} (ref 0.00–0.15)
Total Protein, Urine: 7 mg/dL

## 2017-12-23 MED ORDER — PRENATAL MULTIVITAMIN CH
1.0000 | ORAL_TABLET | Freq: Every day | ORAL | Status: DC
  Administered 2017-12-25: 1 via ORAL
  Filled 2017-12-23: qty 1

## 2017-12-23 MED ORDER — ONDANSETRON HCL 4 MG/2ML IJ SOLN: 4.0000 mg | Freq: Four times a day (QID) | INTRAMUSCULAR | Status: DC | PRN

## 2017-12-23 MED ORDER — FAMOTIDINE 20 MG PO TABS
40.0000 mg | ORAL_TABLET | Freq: Once | ORAL | Status: AC
  Administered 2017-12-24: 40 mg via ORAL
  Filled 2017-12-23: qty 2

## 2017-12-23 MED ORDER — PHENYLEPHRINE 40 MCG/ML (10ML) SYRINGE FOR IV PUSH (FOR BLOOD PRESSURE SUPPORT)
80.0000 ug | PREFILLED_SYRINGE | INTRAVENOUS | Status: DC | PRN
  Filled 2017-12-23: qty 5

## 2017-12-23 MED ORDER — SOD CITRATE-CITRIC ACID 500-334 MG/5ML PO SOLN: 30.0000 mL | ORAL | Status: DC | PRN

## 2017-12-23 MED ORDER — METOCLOPRAMIDE HCL 10 MG PO TABS
10.0000 mg | ORAL_TABLET | Freq: Once | ORAL | Status: AC
  Administered 2017-12-24: 10 mg via ORAL
  Filled 2017-12-23: qty 1

## 2017-12-23 MED ORDER — TETANUS-DIPHTH-ACELL PERTUSSIS 5-2.5-18.5 LF-MCG/0.5 IM SUSP: 0.5000 mL | Freq: Once | INTRAMUSCULAR | Status: DC

## 2017-12-23 MED ORDER — OXYTOCIN 40 UNITS IN LACTATED RINGERS INFUSION - SIMPLE MED: 2.5000 [IU]/h | INTRAVENOUS | Status: DC

## 2017-12-23 MED ORDER — ACETAMINOPHEN 325 MG PO TABS: 650.0000 mg | ORAL_TABLET | ORAL | Status: DC | PRN

## 2017-12-23 MED ORDER — ACETAMINOPHEN 325 MG PO TABS
650.0000 mg | ORAL_TABLET | ORAL | Status: DC | PRN
  Administered 2017-12-24: 650 mg via ORAL
  Filled 2017-12-23: qty 2

## 2017-12-23 MED ORDER — EPHEDRINE 5 MG/ML INJ
10.0000 mg | INTRAVENOUS | Status: DC | PRN
  Filled 2017-12-23: qty 2

## 2017-12-23 MED ORDER — IBUPROFEN 600 MG PO TABS
600.0000 mg | ORAL_TABLET | Freq: Four times a day (QID) | ORAL | Status: DC
  Administered 2017-12-23 – 2017-12-25 (×8): 600 mg via ORAL
  Filled 2017-12-23 (×8): qty 1

## 2017-12-23 MED ORDER — LACTATED RINGERS IV SOLN: INTRAVENOUS | Status: DC

## 2017-12-23 MED ORDER — LACTATED RINGERS IV SOLN
INTRAVENOUS | Status: DC
  Administered 2017-12-24: 20 mL/h via INTRAVENOUS

## 2017-12-23 MED ORDER — LIDOCAINE HCL (PF) 1 % IJ SOLN
30.0000 mL | INTRAMUSCULAR | Status: DC | PRN
  Filled 2017-12-23: qty 30

## 2017-12-23 MED ORDER — OXYCODONE-ACETAMINOPHEN 5-325 MG PO TABS: 2.0000 | ORAL_TABLET | ORAL | Status: DC | PRN

## 2017-12-23 MED ORDER — OXYTOCIN BOLUS FROM INFUSION: 500.0000 mL | Freq: Once | INTRAVENOUS | Status: DC

## 2017-12-23 MED ORDER — LACTATED RINGERS IV SOLN: 500.0000 mL | Freq: Once | INTRAVENOUS | Status: DC

## 2017-12-23 MED ORDER — COCONUT OIL OIL: 1.0000 "application " | TOPICAL_OIL | Status: DC | PRN

## 2017-12-23 MED ORDER — DIPHENHYDRAMINE HCL 50 MG/ML IJ SOLN: 12.5000 mg | INTRAMUSCULAR | Status: DC | PRN

## 2017-12-23 MED ORDER — BENZOCAINE-MENTHOL 20-0.5 % EX AERO: 1.0000 "application " | INHALATION_SPRAY | CUTANEOUS | Status: DC | PRN

## 2017-12-23 MED ORDER — OXYCODONE-ACETAMINOPHEN 5-325 MG PO TABS: 1.0000 | ORAL_TABLET | ORAL | Status: DC | PRN

## 2017-12-23 MED ORDER — LACTATED RINGERS IV SOLN
INTRAVENOUS | Status: DC
  Administered 2017-12-23: 10:00:00 via INTRAVENOUS

## 2017-12-23 MED ORDER — OXYTOCIN 40 UNITS IN LACTATED RINGERS INFUSION - SIMPLE MED
2.5000 [IU]/h | INTRAVENOUS | Status: DC
  Filled 2017-12-23: qty 1000

## 2017-12-23 MED ORDER — DIPHENHYDRAMINE HCL 25 MG PO CAPS: 25.0000 mg | ORAL_CAPSULE | Freq: Four times a day (QID) | ORAL | Status: DC | PRN

## 2017-12-23 MED ORDER — SIMETHICONE 80 MG PO CHEW: 80.0000 mg | CHEWABLE_TABLET | ORAL | Status: DC | PRN

## 2017-12-23 MED ORDER — ONDANSETRON HCL 4 MG PO TABS: 4.0000 mg | ORAL_TABLET | ORAL | Status: DC | PRN

## 2017-12-23 MED ORDER — SENNOSIDES-DOCUSATE SODIUM 8.6-50 MG PO TABS
2.0000 | ORAL_TABLET | ORAL | Status: DC
  Administered 2017-12-24 – 2017-12-25 (×2): 2 via ORAL
  Filled 2017-12-23 (×2): qty 2

## 2017-12-23 MED ORDER — ONDANSETRON HCL 4 MG/2ML IJ SOLN: 4.0000 mg | INTRAMUSCULAR | Status: DC | PRN

## 2017-12-23 MED ORDER — DIBUCAINE 1 % RE OINT: 1.0000 "application " | TOPICAL_OINTMENT | RECTAL | Status: DC | PRN

## 2017-12-23 MED ORDER — PHENYLEPHRINE 40 MCG/ML (10ML) SYRINGE FOR IV PUSH (FOR BLOOD PRESSURE SUPPORT)
80.0000 ug | PREFILLED_SYRINGE | INTRAVENOUS | Status: DC | PRN
  Filled 2017-12-23: qty 5
  Filled 2017-12-23: qty 10

## 2017-12-23 MED ORDER — LIDOCAINE HCL (PF) 1 % IJ SOLN
INTRAMUSCULAR | Status: DC | PRN
  Administered 2017-12-23 (×2): 5 mL via EPIDURAL

## 2017-12-23 MED ORDER — FENTANYL CITRATE (PF) 100 MCG/2ML IJ SOLN
100.0000 ug | INTRAMUSCULAR | Status: DC | PRN
  Administered 2017-12-23: 100 ug via INTRAVENOUS
  Filled 2017-12-23: qty 2

## 2017-12-23 MED ORDER — LIDOCAINE HCL (PF) 1 % IJ SOLN: 30.0000 mL | INTRAMUSCULAR | Status: DC | PRN

## 2017-12-23 MED ORDER — OXYTOCIN BOLUS FROM INFUSION
500.0000 mL | Freq: Once | INTRAVENOUS | Status: AC
  Administered 2017-12-23: 500 mL via INTRAVENOUS

## 2017-12-23 MED ORDER — IBUPROFEN 600 MG PO TABS: 600.0000 mg | ORAL_TABLET | Freq: Once | ORAL | Status: DC

## 2017-12-23 MED ORDER — WITCH HAZEL-GLYCERIN EX PADS: 1.0000 "application " | MEDICATED_PAD | CUTANEOUS | Status: DC | PRN

## 2017-12-23 MED ORDER — FENTANYL 2.5 MCG/ML BUPIVACAINE 1/10 % EPIDURAL INFUSION (WH - ANES)
14.0000 mL/h | INTRAMUSCULAR | Status: DC | PRN
  Administered 2017-12-23: 14 mL/h via EPIDURAL
  Filled 2017-12-23: qty 100

## 2017-12-23 MED ORDER — LACTATED RINGERS IV SOLN: 500.0000 mL | INTRAVENOUS | Status: DC | PRN

## 2017-12-23 MED ORDER — ZOLPIDEM TARTRATE 5 MG PO TABS: 5.0000 mg | ORAL_TABLET | Freq: Every evening | ORAL | Status: DC | PRN

## 2017-12-23 MED ORDER — FENTANYL 2.5 MCG/ML BUPIVACAINE 1/10 % EPIDURAL INFUSION (WH - ANES): 14.0000 mL/h | INTRAMUSCULAR | Status: DC | PRN

## 2017-12-23 NOTE — H&P (Signed)
Tiffany Tapia is a 32 y.o. female, [phone] at 40+5 weeks, presenting in normal labor. BP elevated on admission so labs drawn.  Pt denied headache.  Positive FM.  Prenatal history remarkable for hx of thrombocytopenia platelets at 148 at 28 weeks, borderline Hep B test, negative for core antibody, repeat in March negative. Pt desires permanent sterilization with paper signed, and rubella non-immune.  Pt is a current smoker.  Patient Active Problem List   Diagnosis Date Noted  . Indication for care in labor or delivery 06/06/2015  . SVD (spontaneous vaginal delivery) 06/06/2015  . Vaginal delivery 08/25/2011  . Normal pregnancy, incidental 07/15/2011  . Normal pregnancy, incidental 07/04/2011    History of present pregnancy: Patient tx care to [facility] at 16 weeks.   EDC of 12/18/2017 was established by LMP   Anatomy scan: 19+5 weeks, with normal finding   .   Significant prenatal events:  See above Last evaluation: Today  OB History    Gravida  4   Para  3   Term  3   Preterm  0   AB  0   Living  3     SAB  0   TAB  0   Ectopic  0   Multiple  0   Live Births  3          Past Medical History:  Diagnosis Date  . GBS carrier   . H/O candidiasis   . H/O pyelonephritis during pregnancy    At 20weeks  . H/O varicella   . Medical history non-contributory   . Normal pregnancy, incidental 07/04/2011   Transfer of care 16 weeks   . Spotting during pregnancy    Second trimester   . Thrombocytopenia (HCC)   . Urinary tract infection    Past Surgical History:  Procedure Laterality Date  . NO PAST SURGERIES     Family History: family history includes Anemia in her mother; Diabetes in her maternal grandmother. Social History:  reports that she quit smoking about 7 years ago. Her smoking use included cigarettes. She has never used smokeless tobacco. She reports that she does not drink alcohol or use drugs.   Prenatal Transfer Tool  Maternal Diabetes: No Genetic  Screening: Normal Maternal Ultrasounds/Referrals: Normal Fetal Ultrasounds or other Referrals:  None Maternal Substance Abuse:  Yes:  Type: Smoker Significant Maternal Medications:  None Significant Maternal Lab Results: None  TDAP 09/26/2017 @28 +1 ROS:  Denies VB and LOF.Reports positive fetal movement.  Allergies  Allergen Reactions  . 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 Morris, RNC Blood pressure (!) 137/98, pulse 76, temperature 98 F (36.7 C), temperature source Oral, resp. rate 18, height 5\' 7"  (1.702 m), weight 92.6 kg, SpO2 99 %, unknown if currently breastfeeding.  Chest clear Heart RRR without murmur Abd gravid, NT, FH appropriate Pelvic: per CNM in triage Ext: Neg  FHR: Category 1 FHTs 130 accels no decels. UCs:  4-7   Prenatal labs: ABO, Rh: --/--/O POS (10/22 4540) Antibody: NEG (10/22 0938) Rubella:    non-immune RPR:   NR HBsAg:    HIV:   NR GBS:  Neg Sickle cell/Hgb electrophoresis: AA Pap:  2015 neg GC: Neg Chlamydia:  Neg Genetic screenings:  Neg Glucola:  95 Other:   Hgb  at 12.6 NOB, 11.6 at 28 weeks       Assessment/Plan: IUP at 40.5 early active labor Cat 1  strip Desires permanent sterilization Plan: Admit to Birthing Suite  Routine [facility] orders Pain med/epidural prn   Henderson Newcomer ProtheroCNM, MSN 12/23/2017, 12:03 PM

## 2017-12-23 NOTE — MAU Note (Signed)
Pt presents with c/o regular ctxs since 0530 this morning, reports ctxs 5 minutes apart.  Denies LOF or VB.  Reports +FM.

## 2017-12-23 NOTE — Progress Notes (Signed)
Subjective: Pt laboring, coping well with contractions. Requested pain medication, IV Fentanyl given by RN  Objective: BP (!) 156/101   Pulse 75   Temp 98 F (36.7 C) (Oral)   Resp 16   Ht 5\' 7"  (1.702 m)   Wt 92.6 kg   SpO2 99%   BMI 31.99 kg/m   FHT: Category 1 FHT 125 accels, variability present UC:   regular, every 4-7 minutes SVE:   Dilation: 5.5 Effacement (%): 90 Exam by:: nancy,CNM SROM with small amt of clear fluid during SVE. No odor.  Assessment:  Active labor.  Cat 1 strip  Plan: Epidural as needed. Anticipate SVD. BTL for following day.  Kenney Houseman CNM, MSN 12/23/2017, 12:26 PM

## 2017-12-23 NOTE — Anesthesia Procedure Notes (Signed)
Epidural Patient location during procedure: OB  Staffing Anesthesiologist: Elnoria Livingston, MD Performed: anesthesiologist   Preanesthetic Checklist Completed: patient identified, site marked, surgical consent, pre-op evaluation, timeout performed, IV checked, risks and benefits discussed and monitors and equipment checked  Epidural Patient position: sitting Prep: DuraPrep Patient monitoring: heart rate, continuous pulse ox and blood pressure Approach: right paramedian Location: L3-L4 Injection technique: LOR saline  Needle:  Needle type: Tuohy  Needle gauge: 17 G Needle length: 9 cm and 9 Needle insertion depth: 6 cm Catheter type: closed end flexible Catheter size: 20 Guage Catheter at skin depth: 10 cm Test dose: negative  Assessment Events: blood not aspirated, injection not painful, no injection resistance, negative IV test and no paresthesia  Additional Notes Patient identified. Risks/Benefits/Options discussed with patient including but not limited to bleeding, infection, nerve damage, paralysis, failed block, incomplete pain control, headache, blood pressure changes, nausea, vomiting, reactions to medication both or allergic, itching and postpartum back pain. Confirmed with bedside nurse the patient's most recent platelet count. Confirmed with patient that they are not currently taking any anticoagulation, have any bleeding history or any family history of bleeding disorders. Patient expressed understanding and wished to proceed. All questions were answered. Sterile technique was used throughout the entire procedure. Please see nursing notes for vital signs. Test dose was given through epidural needle and negative prior to continuing to dose epidural or start infusion. Warning signs of high block given to the patient including shortness of breath, tingling/numbness in hands, complete motor block, or any concerning symptoms with instructions to call for help. Patient was given  instructions on fall risk and not to get out of bed. All questions and concerns addressed with instructions to call with any issues.     

## 2017-12-23 NOTE — MAU Provider Note (Signed)
S: Ms. Tiffany Tapia is a 32 y.o. [phone] at [redacted]w[redacted]d  who presents to MAU today for labor evaluation.     Cervical exam by RN:  Dilation: 4 Effacement (%): 70 Cervical Position: Posterior Presentation: Vertex Exam by:: F. Morris, RNC  Fetal Monitoring: Baseline135 Variability: moderate Accelerations: 15x15 Decelerations: None Contractions: Irregular q 4-7 min  MDM Discussed patient with RN. NST reviewed.   A: [facility]UP at [redacted]w[redacted]d  Elevated pressures in MAU, PEC labs ordered  P: Discussed with N. Prothero, CNM Admit to [employer], [place] 12/23/2017 9:18 AM

## 2017-12-23 NOTE — Lactation Note (Signed)
This note was copied from a baby's chart. Lactation Consultation Note  Patient Name: Tiffany Tapia Date: 12/23/2017 Reason for consult: Initial assessment;Term  19 hours old FT female who is being exclusively BF by her mother, she's a P4 and experienced BF. She didn't BF her first child but was able to BF her second one for 36 months and her third one for 24 months. Mom already knows how to hand express and able to see colostrum when doing so. She has a Spectra DEBP at home.  Mom just started nursing baby when entering the room, LC noticed that baby was swaddled while at the breast, LC recommended STS and mom unwrapped baby. She was nursing in typical cradle position, suggested mot to try cross cradle instead and clicking stopped. Still unable to hear swallows though but observed long extensions of the jaw. Per mom feeding at the breast are comfortable, both of her nipples looked intact upon examination. Baby still nursing when exiting the room at the 8 minutes mark. Cluster feeding was discussed.  Feeding plan:  1. Encouraged mom to feed baby STS 8-12 times/24 hours or sooner if feeding cues are present. 2. Hand expression and spoon feeding was also encouraged  BF brochure, BF resources and feeding diary were also discussed. Parents reported all questions and concerns were answered, they're both aware of LC services and will call PRN.  Maternal Data Formula Feeding for Exclusion: No Has patient been taught Hand Expression?: Yes Does the patient have breastfeeding experience prior to this delivery?: Yes  Feeding Feeding Type: Breast Fed  LATCH Score Latch: Repeated attempts needed to sustain latch, nipple held in mouth throughout feeding, stimulation needed to elicit sucking reflex.  Audible Swallowing: None(lots of clicking on typical cradle, not swallows)  Type of Nipple: Everted at rest and after stimulation  Comfort (Breast/Nipple): Soft / non-tender  Hold  (Positioning): No assistance needed to correctly position infant at breast.(minimal assistance needed)  LATCH Score: 7  Interventions Interventions: Breast feeding basics reviewed;Assisted with latch;Skin to skin;Breast massage;Hand express;Breast compression;Adjust position;Support pillows  Lactation Tools Discussed/Used WIC Program: No   Consult Status Consult Status: Follow-up Date: 12/24/17 Follow-up type: In-patient    Essynce Munsch Venetia Constable 12/23/2017, 11:28 PM

## 2017-12-23 NOTE — Anesthesia Preprocedure Evaluation (Signed)

## 2017-12-24 ENCOUNTER — Encounter (HOSPITAL_COMMUNITY): Payer: Self-pay | Admitting: Anesthesiology

## 2017-12-24 ENCOUNTER — Inpatient Hospital Stay (HOSPITAL_COMMUNITY): Payer: Medicaid Other | Admitting: Anesthesiology

## 2017-12-24 ENCOUNTER — Encounter (HOSPITAL_COMMUNITY)
Admission: AD | Disposition: A | Discharge status: Home / Self Care | Payer: Self-pay | Source: Home / Self Care | Attending: Obstetrics and Gynecology

## 2017-12-24 ENCOUNTER — Other Ambulatory Visit: Payer: Self-pay

## 2017-12-24 DIAGNOSIS — O1495 Unspecified pre-eclampsia, complicating the puerperium: Secondary | ICD-10-CM | POA: Diagnosis not present

## 2017-12-24 HISTORY — PX: TUBAL LIGATION: SHX77

## 2017-12-24 LAB — CBC
HEMATOCRIT: 36 % (ref 36.0–46.0)
HEMATOCRIT: 34.6 % — AB (ref 36.0–46.0)
HEMOGLOBIN: 12.2 g/dL (ref 12.0–15.0)
Hemoglobin: 12.5 g/dL (ref 12.0–15.0)
MCH: 32.9 pg (ref 26.0–34.0)
MCH: 33.6 pg (ref 26.0–34.0)
MCHC: 34.7 g/dL (ref 30.0–36.0)
MCHC: 35.3 g/dL (ref 30.0–36.0)
MCV: 94.7 fL (ref 80.0–100.0)
MCV: 95.3 fL (ref 80.0–100.0)
Platelets: 129 10*3/uL — ABNORMAL LOW (ref 150–400)
Platelets: 127 10*3/uL — ABNORMAL LOW (ref 150–400)
RBC: 3.8 MIL/uL — ABNORMAL LOW (ref 3.87–5.11)
RBC: 3.63 MIL/uL — AB (ref 3.87–5.11)
RDW: 12.8 % (ref 11.5–15.5)
RDW: 12.7 % (ref 11.5–15.5)
WBC: 13.7 10*3/uL — AB (ref 4.0–10.5)
WBC: 10.4 10*3/uL (ref 4.0–10.5)
nRBC: 0 % (ref 0.0–0.2)
nRBC: 0 % (ref 0.0–0.2)

## 2017-12-24 LAB — PROTEIN / CREATININE RATIO, URINE
Creatinine, Urine: 44 mg/dL
PROTEIN CREATININE RATIO: 0.34 mg/mg{creat} — AB (ref 0.00–0.15)
TOTAL PROTEIN, URINE: 15 mg/dL

## 2017-12-24 LAB — COMPREHENSIVE METABOLIC PANEL
ALK PHOS: 95 U/L (ref 38–126)
ALT: 16 U/L (ref 0–44)
ANION GAP: 7 (ref 5–15)
AST: 27 U/L (ref 15–41)
Albumin: 3.1 g/dL — ABNORMAL LOW (ref 3.5–5.0)
BILIRUBIN TOTAL: 0.8 mg/dL (ref 0.3–1.2)
BUN: 6 mg/dL (ref 6–20)
CALCIUM: 8.4 mg/dL — AB (ref 8.9–10.3)
CO2: 21 mmol/L — ABNORMAL LOW (ref 22–32)
CREATININE: 0.65 mg/dL (ref 0.44–1.00)
Chloride: 110 mmol/L (ref 98–111)
GFR calc non Af Amer: >60 mL/min (ref >60)
Glucose, Bld: 106 mg/dL — ABNORMAL HIGH (ref 70–99)
Potassium: 3.6 mmol/L (ref 3.5–5.1)
Sodium: 138 mmol/L (ref 135–145)
Total Protein: 5.8 g/dL — ABNORMAL LOW (ref 6.5–8.1)

## 2017-12-24 LAB — RPR: RPR Ser Ql: NONREACTIVE

## 2017-12-24 SURGERY — LIGATION, FALLOPIAN TUBE, POSTPARTUM
Anesthesia: Epidural | Laterality: Bilateral

## 2017-12-24 MED ORDER — BUPIVACAINE HCL (PF) 0.25 % IJ SOLN
INTRAMUSCULAR | Status: DC | PRN
  Administered 2017-12-24: 20 mL

## 2017-12-24 MED ORDER — MEASLES, MUMPS & RUBELLA VAC ~~LOC~~ INJ
0.5000 mL | INJECTION | Freq: Once | SUBCUTANEOUS | Status: AC
  Administered 2017-12-25: 0.5 mL via SUBCUTANEOUS
  Filled 2017-12-24 (×2): qty 0.5

## 2017-12-24 MED ORDER — PROMETHAZINE HCL 25 MG/ML IJ SOLN: 6.2500 mg | INTRAMUSCULAR | Status: DC | PRN

## 2017-12-24 MED ORDER — SODIUM BICARBONATE 8.4 % IV SOLN
INTRAVENOUS | Status: AC
  Filled 2017-12-24: qty 50

## 2017-12-24 MED ORDER — KETOROLAC TROMETHAMINE 30 MG/ML IJ SOLN: 30.0000 mg | Freq: Once | INTRAMUSCULAR | Status: DC | PRN

## 2017-12-24 MED ORDER — LIDOCAINE-EPINEPHRINE (PF) 2 %-1:200000 IJ SOLN
INTRAMUSCULAR | Status: AC
  Filled 2017-12-24: qty 20

## 2017-12-24 MED ORDER — BUPIVACAINE HCL (PF) 0.25 % IJ SOLN
INTRAMUSCULAR | Status: AC
  Filled 2017-12-24: qty 30

## 2017-12-24 MED ORDER — NIFEDIPINE ER OSMOTIC RELEASE 30 MG PO TB24
30.0000 mg | ORAL_TABLET | Freq: Every day | ORAL | Status: DC
  Administered 2017-12-24 – 2017-12-25 (×2): 30 mg via ORAL
  Filled 2017-12-24 (×2): qty 1

## 2017-12-24 MED ORDER — OXYCODONE HCL 5 MG PO TABS
5.0000 mg | ORAL_TABLET | ORAL | Status: DC | PRN
  Administered 2017-12-24: 5 mg via ORAL
  Filled 2017-12-24: qty 1

## 2017-12-24 MED ORDER — HYDROMORPHONE HCL 1 MG/ML IJ SOLN: 0.2500 mg | INTRAMUSCULAR | Status: DC | PRN

## 2017-12-24 MED ORDER — SODIUM BICARBONATE 8.4 % IV SOLN
INTRAVENOUS | Status: DC | PRN
  Administered 2017-12-24: 10 mL via EPIDURAL
  Administered 2017-12-24 (×2): 5 mL via EPIDURAL

## 2017-12-24 MED ORDER — MEPERIDINE HCL 25 MG/ML IJ SOLN: 6.2500 mg | INTRAMUSCULAR | Status: DC | PRN

## 2017-12-24 SURGICAL SUPPLY — 29 items
BENZOIN TINCTURE PRP APPL 2/3 (GAUZE/BANDAGES/DRESSINGS) ×3 IMPLANT
CLOSURE STERI STRIP 1/2 X4 (GAUZE/BANDAGES/DRESSINGS) ×3 IMPLANT
CLOTH BEACON ORANGE TIMEOUT ST (SAFETY) ×3 IMPLANT
DERMABOND ADVANCED (GAUZE/BANDAGES/DRESSINGS) ×2
DERMABOND ADVANCED .7 DNX12 (GAUZE/BANDAGES/DRESSINGS) ×1 IMPLANT
DRSG OPSITE POSTOP 3X4 (GAUZE/BANDAGES/DRESSINGS) ×3 IMPLANT
DURAPREP 26ML APPLICATOR (WOUND CARE) ×3 IMPLANT
ELECT REM PT RETURN 9FT ADLT (ELECTROSURGICAL) ×3
ELECTRODE REM PT RTRN 9FT ADLT (ELECTROSURGICAL) ×1 IMPLANT
GLOVE BIO SURGEON STRL SZ 6.5 (GLOVE) ×2 IMPLANT
GLOVE BIO SURGEONS STRL SZ 6.5 (GLOVE) ×1
GLOVE BIOGEL PI IND STRL 7.0 (GLOVE) ×3 IMPLANT
GLOVE BIOGEL PI INDICATOR 7.0 (GLOVE) ×6
GOWN STRL REUS W/TWL LRG LVL3 (GOWN DISPOSABLE) ×6 IMPLANT
NEEDLE HYPO 22GX1.5 SAFETY (NEEDLE) ×3 IMPLANT
NS IRRIG 1000ML POUR BTL (IV SOLUTION) ×3 IMPLANT
PACK ABDOMINAL MINOR (CUSTOM PROCEDURE TRAY) ×3 IMPLANT
PENCIL BUTTON HOLSTER BLD 10FT (ELECTRODE) ×3 IMPLANT
PROTECTOR NERVE ULNAR (MISCELLANEOUS) ×3 IMPLANT
SPONGE LAP 4X18 RFD (DISPOSABLE) ×3 IMPLANT
SUT MON AB 4-0 PS1 27 (SUTURE) ×3 IMPLANT
SUT PLAIN 0 NONE (SUTURE) IMPLANT
SUT VIC AB 0 CT1 27 (SUTURE) ×6
SUT VIC AB 0 CT1 27XBRD ANBCTR (SUTURE) ×3 IMPLANT
SUT VICRYL 0 UR6 27IN ABS (SUTURE) ×6 IMPLANT
SYR CONTROL 10ML LL (SYRINGE) ×3 IMPLANT
TOWEL OR 17X24 6PK STRL BLUE (TOWEL DISPOSABLE) ×6 IMPLANT
TRAY FOLEY CATH SILVER 14FR (SET/KITS/TRAYS/PACK) ×3 IMPLANT
WATER STERILE IRR 1000ML POUR (IV SOLUTION) ×3 IMPLANT

## 2017-12-24 NOTE — Anesthesia Postprocedure Evaluation (Signed)
Anesthesia Post Note  Patient: Tiffany Tapia  Procedure(s) Performed: AN AD HOC LABOR EPIDURAL     Patient location during evaluation: Mother Baby Anesthesia Type: Epidural Level of consciousness: awake and alert Pain management: pain level controlled Vital Signs Assessment: post-procedure vital signs reviewed and stable Respiratory status: spontaneous breathing, nonlabored ventilation and respiratory function stable Cardiovascular status: stable Postop Assessment: no headache, no backache, epidural receding, able to ambulate, adequate PO intake, no apparent nausea or vomiting and patient able to bend at knees Anesthetic complications: no    Last Vitals:  Vitals:   12/24/17 0521 12/24/17 0700  BP: (!) 139/97 122/88  Pulse: 74 70  Resp: 18   Temp: 36.8 C   SpO2:      Last Pain:  Vitals:   12/24/17 0530  TempSrc:   PainSc: 1    Pain Goal: Patients Stated Pain Goal: 1 (12/23/17 1811)               Laban Emperor

## 2017-12-24 NOTE — Transfer of Care (Signed)
Immediate Anesthesia Transfer of Care Note  Patient: Tiffany Tapia  Procedure(s) Performed: POST PARTUM TUBAL LIGATION (Bilateral )  Patient Location: PACU  Anesthesia Type:Epidural  Level of Consciousness: awake, alert  and oriented  Airway & Oxygen Therapy: Patient Spontanous Breathing  Post-op Assessment: Report given to RN and Post -op Vital signs reviewed and stable  Post vital signs: Reviewed and stable  Last Vitals:  Vitals Value Taken Time  BP 148/90 12/24/2017 10:49 AM  Temp    Pulse 83 12/24/2017 10:53 AM  Resp 14 12/24/2017 10:53 AM  SpO2 100 % 12/24/2017 10:53 AM  Vitals shown include unvalidated device data.  Last Pain:  Vitals:   12/24/17 0837  TempSrc: Oral  PainSc:       Patients Stated Pain Goal: 1 (12/23/17 1811)  Complications: No apparent anesthesia complications

## 2017-12-24 NOTE — Progress Notes (Signed)
B/P on return from PACU was 144/99, then 15 minutes later was 145/107. No RUQ pain, no headache, not seeing any spots. 1+ reflexes, no clonus. Abdominal cramping now =7 with breast feeding. Medicated her for pain. Notified Dr Mora Appl of above. No new orders except to call if PIH symptoms worsen or B/P increases.

## 2017-12-24 NOTE — Anesthesia Preprocedure Evaluation (Signed)
Anesthesia Evaluation  Patient identified by MRN, date of birth, ID band Patient awake    Reviewed: Allergy & Precautions, H&P , NPO status , Patient's Chart, lab work & pertinent test results  History of Anesthesia Complications Negative for: history of anesthetic complications  Airway Mallampati: II  TM Distance: >3 FB Neck ROM: full    Dental no notable dental hx. (+) Teeth Intact   Pulmonary former smoker,    Pulmonary exam normal breath sounds clear to auscultation       Cardiovascular negative cardio ROS Normal cardiovascular exam Rhythm:regular Rate:Normal     Neuro/Psych negative neurological ROS  negative psych ROS   GI/Hepatic negative GI ROS, Neg liver ROS,   Endo/Other  negative endocrine ROS  Renal/GU negative Renal ROS  negative genitourinary   Musculoskeletal   Abdominal (+) + obese,   Peds  Hematology negative hematology ROS (+)   Anesthesia Other Findings   Reproductive/Obstetrics (+) Pregnancy                             Anesthesia Physical  Anesthesia Plan  ASA: II  Anesthesia Plan: Epidural   Post-op Pain Management:    Induction:   PONV Risk Score and Plan: 2 and Ondansetron and Dexamethasone  Airway Management Planned:   Additional Equipment:   Intra-op Plan:   Post-operative Plan:   Informed Consent: I have reviewed the patients History and Physical, chart, labs and discussed the procedure including the risks, benefits and alternatives for the proposed anesthesia with the patient or authorized representative who has indicated his/her understanding and acceptance.     Plan Discussed with: CRNA and Surgeon  Anesthesia Plan Comments:         Anesthesia Quick Evaluation

## 2017-12-24 NOTE — Anesthesia Postprocedure Evaluation (Signed)
Anesthesia Post Note  Patient: Tiffany Tapia  Procedure(s) Performed: POST PARTUM TUBAL LIGATION (Bilateral )     Patient location during evaluation: Mother Baby Anesthesia Type: Epidural Level of consciousness: awake Pain management: pain level controlled Vital Signs Assessment: post-procedure vital signs reviewed and stable Respiratory status: spontaneous breathing Cardiovascular status: stable Postop Assessment: no headache, no backache, epidural receding and no apparent nausea or vomiting Anesthetic complications: no    Last Vitals:  Vitals:   12/24/17 1206 12/24/17 1221  BP: (!) 144/99 (!) 145/107  Pulse: 79   Resp: 15   Temp:    SpO2: 96%     Last Pain:  Vitals:   12/24/17 1236  TempSrc:   PainSc: 7    Pain Goal: Patients Stated Pain Goal: 2 (12/24/17 1236)               Caren Macadam

## 2017-12-24 NOTE — Progress Notes (Signed)
MD pre operative note  Procedure: post partum tubal   Indication: Desired permanent sterilization  Discussed with patient the risks, benefits and alternatives of the procedure to include the permanent nature of the procedure and the risk of regret, infection, bleeding, the inability to complete the procedure. She voiced understanding ,  And all inquiries and questions answered.   Consent signed, witnessed and placed into chart.   Tiffany Tapia [name]

## 2017-12-24 NOTE — Progress Notes (Addendum)
Called by RN re: elevated BP s/p BTL with good pain control now.  Reviewed BP since admission. Reviewed prior CBC/CMP/UPCR.  Will get CMP and UPCR now. Closely monitor BP over next few hours. Recommend starting nifedipine 30mg  XL daily - consulted with Dr Mora Appl who agrees with POC.   1827: UPCR 0.34 c/w preeclampsia. AFT/ALT normal. Preeclampsia without severe features.

## 2017-12-24 NOTE — Progress Notes (Signed)
Post Partum Day 1 Subjective: no complaints, up ad lib, voiding, tolerating PO and + flatus Anxious about BTL after talking with anesthesiologist - husband was worried for her about anesthesia. She is looking forward to her BTL and wants to proceed. No HA. Bleeding minimal. BF without issue - pumping pre-op.  Objective: Blood pressure (!) 144/99, pulse 79, temperature 97.7 F (36.5 C), temperature source Oral, resp. rate 15, height 5\' 7"  (1.702 m), weight 92.6 kg, SpO2 96 %, unknown if currently breastfeeding.  Physical Exam:  General: cooperative and no distress Uterine Fundus: firm below U DVT Evaluation: No significant calf/ankle edema.  Recent Labs    12/24/17 0536 12/24/17 1131  HGB 12.5 12.2  HCT 36.0 34.6*    Assessment/Plan: Plan for discharge tomorrow and Breastfeeding  BTL today Gestational HTN - BP stable. Baby love referral placed.  Rubella non-immune - vaccine prior to discharge. Declined flu vaccine.  H/o anxiety - recommend 2wk in office mood check.   LOS: 1 day   Tiffany Tapia 12/24/2017, 12:15 PM

## 2017-12-24 NOTE — Op Note (Signed)
OPERATIVE NOTE  Tiffany Tapia  DOB:    06-19-1985  MRN:    [id number]  CSN:    [id number]  Date of Surgery:  12/24/2017   Preoperative Diagnosis: Desired Sterilization  Postoperative Diagnosis: Same as above  Procedure:  Post partum tubal ligation via bilateral salpingectomy  Surgeon: Delila Spence. Mora Appl, M.D.  Assistant: None  Anesthetic: Local, Epidural    Fluids: 200 mL Catheter: foley EBL: 25 mL Complications: None  Indication:  Ms.Kleiman is a 32 y.o. year old female,who desires permanent sterilization. Risks, benefits, alternatives and indication was explained to patient prior to the procedure.   Findings:  After adequate level of epidural was determined for surgery the patient was prepped and draped in the usual sterile fashion.  The umbilical area was infiltrated with 10 mL of 0.25% Marcaine. A small subumbilical incision was performed  using a 15 blade scalpel. The fascia was entered sharply using Mayo scissors and the peritoneum identified and easily entered. The fascial ends were tagged with 0-vicryl. The patient was placed in slight Trendelenburg  Position and tilted to the right to see the left fundus.  The fundus of the uterus was easily located with the surgeon's fingers through the incision and the left fallopian tube was swept into view so that the midportion of the tube could be grasped with Babcock clamps and delivered. As the loop was held up, the fallopian tube was traced down to the fimbriated end to ensure that this was the fallopian tube.    Sequential clamping with kelly clamps along the mesosalpinx from the fimbriated end to the fundus was performed and the tube dissected with the bovie. The pedicles were suture ligated with 0-vicryl.  The tubal pedicles were hemostatic.  The above was performed first on the patient's left side then the patient's right side.     The fascia was reapproximated with 0 vicryl on UR-6. The skin was reapproximated with  subcuticular stitch of 4-0 monocryl. 10 more mL's of 0.25% Marcaine was infiltrated into the incision for patient comfort. The patient's incision was cleaned and steri strips and a honeycomb dressing was placed.   The patient tolerated the procedure well and was sent to the PACU in good condition. All instrument, sponge and needle counts were correct x 3  Joenathan Sakuma [name]

## 2017-12-24 NOTE — Lactation Note (Signed)
This note was copied from a baby's chart. Lactation Consultation Note  Patient Name: Girl Indiyah Paone [id number] Date: 12/24/2017 Reason for consult: Follow-up assessment;Term  P4 mother whose infant is now 48 hours old.  Mother has breast feeding experience   She has no questions/concerns related to breast feeding at this time.  Baby is latching and feeding well per mom.  Mother will call for assistance as needed.   Maternal Data Formula Feeding for Exclusion: No Has patient been taught Hand Expression?: Yes Does the patient have breastfeeding experience prior to this delivery?: Yes  Feeding Feeding Type: Breast Fed  LATCH Score Latch: Grasps breast easily, tongue down, lips flanged, rhythmical sucking.  Audible Swallowing: A few with stimulation  Type of Nipple: Everted at rest and after stimulation  Comfort (Breast/Nipple): Soft / non-tender  Hold (Positioning): No assistance needed to correctly position infant at breast.  LATCH Score: 9  Interventions    Lactation Tools Discussed/Used WIC Program: No   Consult Status Consult Status: Follow-up Date: 12/25/17 Follow-up type: In-patient    Carmelle Bamberg R Aylen Rambert 12/24/2017, 6:00 PM

## 2017-12-25 DIAGNOSIS — O99892 Other specified diseases and conditions complicating childbirth: Secondary | ICD-10-CM

## 2017-12-25 DIAGNOSIS — Z283 Underimmunization status: Secondary | ICD-10-CM

## 2017-12-25 DIAGNOSIS — O9989 Other specified diseases and conditions complicating pregnancy, childbirth and the puerperium: Secondary | ICD-10-CM

## 2017-12-25 MED ORDER — IBUPROFEN 600 MG PO TABS: 600.0000 mg | ORAL_TABLET | Freq: Four times a day (QID) | ORAL | 0 refills | Status: AC

## 2017-12-25 MED ORDER — NIFEDIPINE ER 30 MG PO TB24: 30.0000 mg | ORAL_TABLET | Freq: Every day | ORAL | 1 refills | Status: AC

## 2017-12-25 NOTE — Discharge Summary (Signed)
SVD OB Discharge Summary     Patient Name: Tiffany Tapia DOB: [birth year] MRN: [id number]  Date of admission: 12/23/2017 Delivering MD: Tiffany Tapia  Date of delivery: 12/23/2017 Type of delivery: SVD  Newborn Data: Sex: female Name: Tiffany Tapia Live born female  Birth Weight: 9 lb 7.5 oz (4295 g) APGAR: 9, 9  Newborn Delivery   Birth date/time:  12/23/2017 14:23:00 Delivery type:  Vaginal, Spontaneous     Feeding: breast Infant being discharge to home with mother in stable condition.   Admitting diagnosis: 40WKS,CTX Intrauterine pregnancy: [redacted]w[redacted]d    Secondary diagnosis:  Principal Problem:   Normal vaginal delivery Active Problems:   Preeclampsia in postpartum period   Rubella nonimmune status, delivered, current hospitalization                                Complications: None                                                              Intrapartum Procedures: spontaneous vaginal delivery Postpartum Procedures: P.P. tubal ligation Complications-Operative and Postpartum: none Augmentation: AROM   History of Present Illness: Ms. Tiffany ZUVERis a 32y.o. female, [id number] who presents at 411w5deeks gestation. [facility]  Her pregnancy has been complicated by:  Patient Active Problem List   Diagnosis Date Noted  . Rubella nonimmune status, delivered, current hospitalization 12/25/2017  . Preeclampsia in postpartum period 12/24/2017  . Normal vaginal delivery 12/24/2017   Hospital course:  Onset of Labor With Vaginal Delivery     [age].o. yo [id number] admitted in Active Labor on 12/23/2017. Patient had an uncomplicated labor course as follows:  Membrane Rupture Time/Date: 12:17 PM ,12/23/2017   Intrapartum Procedures: Episiotomy: None [1]                                         Lacerations:  None [1]  Patient had a delivery of a Viable infant. 12/23/2017  Information for  the patient's newborn:  ThoFermina, Mishkin3[[id number]]elivery Method: Vaginal, Spontaneous(Filed from Delivery Summary)    Pateint had an uncomplicated postpartum course.  She is ambulating, tolerating a regular diet, passing flatus, and urinating well. Patient is discharged home in stable condition on 12/25/17.  Postpartum Day # 2 : S/P NSVD due to spontaneous labor with hx of GHTN, controlled without medication. Admission labs unremarkable for PreE. Patient up ad lib, denies syncope or dizziness. Reports consuming regular diet without issues and denies N/V. Patient reports no bowel movement + passing flatus.  Denies issues with urination and reports bleeding is "light." PP day #1 pt had elevated B/Ps and PCR of 0.34, pt diagnosed with preeclampsia without SF.  Pt had hx of gestational thrpombocytopenia.Platelets 127 post BTL procedure. Pt started on procardia 30XL and BP stable at 121/77. Pt denies h/a, vision changes, or RUQ pain. Patient is breastfeeding and reports going well.  Pt had PP BTL  for postpartum contraception.  Pain is being appropriately managed with use of PO pain meds.  Physical exam  Vitals:   12/24/17 2026 12/25/17 0026 12/25/17 0414 12/25/17 0840  BP: 137/89 119/81 121/77 124/87  Pulse: 78 79 85 78  Resp: 18     Temp: 98.6 F (37 C)     TempSrc: Oral     SpO2: 95%     Weight:      Height:       General: alert, cooperative and no distress Lochia: appropriate Uterine Fundus: firm Perineum: intact, no hematoma noted DVT Evaluation: No evidence of DVT seen on physical exam. Negative Homan's sign. No cords or calf tenderness. No significant calf/ankle edema.  Labs: Lab Results  Component Value Date   WBC 10.4 12/24/2017   HGB 12.2 12/24/2017   HCT 34.6 (L) 12/24/2017   MCV 95.3 12/24/2017   PLT 127 (L) 12/24/2017   CMP Latest Ref Rng & Units 12/24/2017  Glucose 70 - 99 mg/dL 106(H)  BUN 6 - 20 mg/dL 6  Creatinine 0.44 - 1.00 mg/dL 0.65  Sodium 135 -  145 mmol/L 138  Potassium 3.5 - 5.1 mmol/L 3.6  Chloride 98 - 111 mmol/L 110  CO2 22 - 32 mmol/L 21(L)  Calcium 8.9 - 10.3 mg/dL 8.4(L)  Total Protein 6.5 - 8.1 g/dL 5.8(L)  Total Bilirubin 0.3 - 1.2 mg/dL 0.8  Alkaline Phos 38 - 126 U/L 95  AST 15 - 41 U/L 27  ALT 0 - 44 U/L 16    Date of discharge: 12/25/2017 Discharge Diagnoses: Term Pregnancy-delivered and PP PreE, BTL and gestational thrombocytopenia. Discharge instruction: per After Visit Summary and "Baby and Me Booklet".  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   diphenhydramine-acetaminophen 25-500 MG Tabs tablet Commonly known as:  TYLENOL PM Take 2 tablets by mouth at bedtime as needed (sleep).   ibuprofen 600 MG tablet Commonly known as:  ADVIL,MOTRIN Take 1 tablet (600 mg total) by mouth every 6 (six) hours.   NIFEdipine 30 MG 24 hr tablet Commonly known as:  ADALAT CC Take 1 tablet (30 mg total) by mouth daily.   prenatal multivitamin Tabs tablet Take 1 tablet by mouth daily.       Activity:           unrestricted and pelvic rest Advance as tolerated. Pelvic rest for 6 weeks.  Diet:                routine Medications: PNV and Ibuprofen Postpartum contraception: Tubal Ligation Condition:  Pt discharge to home with baby in stable PreE: Babylove to home visit for BP check, pt to continue  Procardia XL 60m daily, monitor BP at home, reposrt s/sx of preE and report BP of greater than 150/100.  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   diphenhydramine-acetaminophen 25-500 MG Tabs tablet Commonly known as:  TYLENOL PM Take 2 tablets by mouth at bedtime as needed (sleep).  ibuprofen 600 MG tablet Commonly known as:  ADVIL,MOTRIN Take 1 tablet (600 mg total) by mouth every 6 (six) hours.   NIFEdipine 30 MG 24 hr tablet Commonly known as:  ADALAT CC Take 1 tablet (30 mg total) by mouth daily.   prenatal multivitamin Tabs tablet Take 1 tablet by mouth daily.       Discharge Follow Up:  Follow-up [facility] Follow up.   Specialty:  Obstetrics and Gynecology Why:  Please call and schedule a one week B/P check with a provider and a 6 week PP provider visit. Contact information: [address] [place] [id number] [place], NP-C, CNM 12/25/2017, 10:02 AM  Noralyn Pick, FNP
